# Patient Record
Sex: Female | Born: 1975 | Race: White | Hispanic: No | Smoking: Never smoker
Health system: Southern US, Community
[De-identification: ages and names within clinical notes are randomized; demographics above are authoritative.]

## PROBLEM LIST (undated history)

## (undated) DIAGNOSIS — I10 Essential (primary) hypertension: Secondary | ICD-10-CM

## (undated) DIAGNOSIS — D649 Anemia, unspecified: Secondary | ICD-10-CM

## (undated) DIAGNOSIS — G459 Transient cerebral ischemic attack, unspecified: Secondary | ICD-10-CM

## (undated) DIAGNOSIS — T8859XA Other complications of anesthesia, initial encounter: Secondary | ICD-10-CM

## (undated) DIAGNOSIS — R112 Nausea with vomiting, unspecified: Secondary | ICD-10-CM

## (undated) DIAGNOSIS — M2550 Pain in unspecified joint: Secondary | ICD-10-CM

## (undated) DIAGNOSIS — N3010 Interstitial cystitis (chronic) without hematuria: Secondary | ICD-10-CM

## (undated) DIAGNOSIS — R011 Cardiac murmur, unspecified: Secondary | ICD-10-CM

## (undated) DIAGNOSIS — N809 Endometriosis, unspecified: Secondary | ICD-10-CM

## (undated) DIAGNOSIS — Z8489 Family history of other specified conditions: Secondary | ICD-10-CM

## (undated) DIAGNOSIS — F1120 Opioid dependence, uncomplicated: Secondary | ICD-10-CM

## (undated) DIAGNOSIS — T4145XA Adverse effect of unspecified anesthetic, initial encounter: Secondary | ICD-10-CM

## (undated) DIAGNOSIS — Z8614 Personal history of Methicillin resistant Staphylococcus aureus infection: Secondary | ICD-10-CM

## (undated) DIAGNOSIS — E039 Hypothyroidism, unspecified: Secondary | ICD-10-CM

## (undated) DIAGNOSIS — Z9889 Other specified postprocedural states: Secondary | ICD-10-CM

## (undated) DIAGNOSIS — N200 Calculus of kidney: Secondary | ICD-10-CM

## (undated) DIAGNOSIS — M545 Low back pain: Secondary | ICD-10-CM

## (undated) HISTORY — PX: OTHER SURGICAL HISTORY: SHX169

## (undated) HISTORY — PX: DIAGNOSTIC LAPAROSCOPY: SUR761

## (undated) HISTORY — DX: Low back pain: M54.5

## (undated) HISTORY — PX: ABDOMINAL HYSTERECTOMY: SHX81

## (undated) HISTORY — PX: LITHOTRIPSY: SUR834

## (undated) HISTORY — DX: Opioid dependence, uncomplicated: F11.20

## (undated) HISTORY — PX: BREAST ENHANCEMENT SURGERY: SHX7

## (undated) HISTORY — DX: Pain in unspecified joint: M25.50

## (undated) HISTORY — PX: APPENDECTOMY: SHX54

---

## 2003-10-17 ENCOUNTER — Ambulatory Visit: Payer: Self-pay | Admitting: Pain Medicine

## 2003-11-22 ENCOUNTER — Other Ambulatory Visit: Payer: Self-pay

## 2003-11-27 ENCOUNTER — Ambulatory Visit: Payer: Self-pay | Admitting: Urology

## 2003-12-13 ENCOUNTER — Ambulatory Visit: Payer: Self-pay | Admitting: Pain Medicine

## 2003-12-27 ENCOUNTER — Ambulatory Visit: Payer: Self-pay | Admitting: Urology

## 2004-02-12 ENCOUNTER — Ambulatory Visit: Payer: Self-pay | Admitting: Pain Medicine

## 2004-04-15 ENCOUNTER — Ambulatory Visit: Payer: Self-pay | Admitting: Pain Medicine

## 2004-04-17 ENCOUNTER — Ambulatory Visit: Payer: Self-pay | Admitting: Pain Medicine

## 2004-05-15 ENCOUNTER — Ambulatory Visit: Payer: Self-pay | Admitting: Pain Medicine

## 2004-05-24 ENCOUNTER — Emergency Department: Payer: Self-pay | Admitting: Unknown Physician Specialty

## 2004-06-05 ENCOUNTER — Ambulatory Visit: Payer: Self-pay | Admitting: Pain Medicine

## 2004-07-15 ENCOUNTER — Ambulatory Visit: Payer: Self-pay | Admitting: Pain Medicine

## 2004-08-12 ENCOUNTER — Ambulatory Visit: Payer: Self-pay | Admitting: Pain Medicine

## 2004-08-26 ENCOUNTER — Ambulatory Visit: Payer: Self-pay | Admitting: Pain Medicine

## 2004-10-09 ENCOUNTER — Ambulatory Visit: Payer: Self-pay | Admitting: Pain Medicine

## 2004-10-17 ENCOUNTER — Ambulatory Visit: Payer: Self-pay | Admitting: Pain Medicine

## 2004-11-03 ENCOUNTER — Ambulatory Visit: Payer: Self-pay | Admitting: Pain Medicine

## 2004-12-04 ENCOUNTER — Ambulatory Visit: Payer: Self-pay | Admitting: Pain Medicine

## 2004-12-24 ENCOUNTER — Ambulatory Visit: Payer: Self-pay | Admitting: Pain Medicine

## 2005-01-05 DIAGNOSIS — G459 Transient cerebral ischemic attack, unspecified: Secondary | ICD-10-CM

## 2005-01-05 HISTORY — DX: Transient cerebral ischemic attack, unspecified: G45.9

## 2005-01-27 ENCOUNTER — Ambulatory Visit: Payer: Self-pay | Admitting: Pain Medicine

## 2005-02-23 ENCOUNTER — Ambulatory Visit: Payer: Self-pay | Admitting: Pain Medicine

## 2005-03-17 ENCOUNTER — Ambulatory Visit: Payer: Self-pay | Admitting: Pain Medicine

## 2005-04-15 ENCOUNTER — Ambulatory Visit: Payer: Self-pay | Admitting: Urology

## 2005-04-23 ENCOUNTER — Inpatient Hospital Stay: Payer: Self-pay | Admitting: Urology

## 2005-05-05 ENCOUNTER — Ambulatory Visit: Payer: Self-pay | Admitting: Pain Medicine

## 2005-06-02 ENCOUNTER — Ambulatory Visit: Payer: Self-pay | Admitting: Pain Medicine

## 2005-06-29 ENCOUNTER — Ambulatory Visit: Payer: Self-pay | Admitting: Pain Medicine

## 2005-07-15 ENCOUNTER — Ambulatory Visit: Payer: Self-pay | Admitting: Urology

## 2005-07-19 ENCOUNTER — Emergency Department: Payer: Self-pay | Admitting: Emergency Medicine

## 2005-07-21 ENCOUNTER — Ambulatory Visit: Payer: Self-pay | Admitting: Pain Medicine

## 2005-07-27 ENCOUNTER — Ambulatory Visit: Payer: Self-pay | Admitting: Pain Medicine

## 2005-12-06 ENCOUNTER — Inpatient Hospital Stay: Payer: Self-pay | Admitting: Vascular Surgery

## 2006-01-13 ENCOUNTER — Emergency Department: Payer: Self-pay | Admitting: Emergency Medicine

## 2006-04-07 ENCOUNTER — Ambulatory Visit: Payer: Self-pay | Admitting: Urology

## 2006-04-08 ENCOUNTER — Ambulatory Visit: Payer: Self-pay | Admitting: Urology

## 2006-08-23 ENCOUNTER — Ambulatory Visit: Payer: Self-pay | Admitting: Obstetrics & Gynecology

## 2006-08-24 ENCOUNTER — Ambulatory Visit: Payer: Self-pay | Admitting: Obstetrics & Gynecology

## 2006-11-05 ENCOUNTER — Ambulatory Visit: Payer: Self-pay | Admitting: Urology

## 2007-02-13 ENCOUNTER — Emergency Department: Payer: Self-pay | Admitting: Emergency Medicine

## 2007-08-17 ENCOUNTER — Ambulatory Visit: Payer: Self-pay | Admitting: Urology

## 2007-08-18 ENCOUNTER — Ambulatory Visit: Payer: Self-pay | Admitting: Urology

## 2007-08-25 ENCOUNTER — Ambulatory Visit: Payer: Self-pay | Admitting: Urology

## 2007-09-13 ENCOUNTER — Ambulatory Visit: Payer: Self-pay | Admitting: Urology

## 2007-09-22 ENCOUNTER — Ambulatory Visit: Payer: Self-pay | Admitting: Urology

## 2007-10-13 ENCOUNTER — Ambulatory Visit: Payer: Self-pay | Admitting: Urology

## 2007-11-23 ENCOUNTER — Ambulatory Visit: Payer: Self-pay | Admitting: Obstetrics & Gynecology

## 2007-11-29 ENCOUNTER — Ambulatory Visit: Payer: Self-pay | Admitting: Obstetrics & Gynecology

## 2008-08-24 ENCOUNTER — Ambulatory Visit: Payer: Self-pay | Admitting: Urology

## 2008-09-09 ENCOUNTER — Emergency Department: Payer: Self-pay | Admitting: Emergency Medicine

## 2008-09-24 ENCOUNTER — Ambulatory Visit: Payer: Self-pay | Admitting: Urology

## 2008-10-30 ENCOUNTER — Ambulatory Visit: Payer: Self-pay | Admitting: Urology

## 2009-02-14 ENCOUNTER — Ambulatory Visit: Payer: Self-pay | Admitting: Urology

## 2009-05-03 ENCOUNTER — Ambulatory Visit: Payer: Self-pay | Admitting: Urology

## 2009-05-10 ENCOUNTER — Ambulatory Visit: Payer: Self-pay | Admitting: Urology

## 2009-10-22 ENCOUNTER — Ambulatory Visit: Payer: Self-pay | Admitting: Urology

## 2010-01-05 DIAGNOSIS — Z8614 Personal history of Methicillin resistant Staphylococcus aureus infection: Secondary | ICD-10-CM

## 2010-01-05 HISTORY — DX: Personal history of Methicillin resistant Staphylococcus aureus infection: Z86.14

## 2010-05-14 ENCOUNTER — Emergency Department: Payer: Self-pay | Admitting: Emergency Medicine

## 2010-07-13 ENCOUNTER — Emergency Department: Payer: Self-pay | Admitting: Emergency Medicine

## 2010-10-24 ENCOUNTER — Other Ambulatory Visit: Payer: Self-pay | Admitting: Family Medicine

## 2010-10-27 ENCOUNTER — Ambulatory Visit: Payer: Self-pay | Admitting: Family Medicine

## 2011-02-09 ENCOUNTER — Emergency Department: Payer: Self-pay | Admitting: Emergency Medicine

## 2011-02-09 LAB — URINALYSIS, COMPLETE
Bacteria: NONE SEEN
Ketone: NEGATIVE
Ph: 7 (ref 4.5–8.0)
RBC,UR: 3 /HPF (ref 0–5)
Specific Gravity: 1.003 (ref 1.003–1.030)
Squamous Epithelial: 1
WBC UR: 2 /HPF (ref 0–5)

## 2011-02-09 LAB — PREGNANCY, URINE: Pregnancy Test, Urine: NEGATIVE m[IU]/mL

## 2011-02-09 LAB — CBC
HCT: 34.8 % — ABNORMAL LOW (ref 35.0–47.0)
HGB: 11.8 g/dL — ABNORMAL LOW (ref 12.0–16.0)
MCHC: 34 g/dL (ref 32.0–36.0)
Platelet: 230 10*3/uL (ref 150–440)
RBC: 4.25 10*6/uL (ref 3.80–5.20)

## 2011-02-09 LAB — BASIC METABOLIC PANEL
BUN: 4 mg/dL — ABNORMAL LOW (ref 7–18)
Creatinine: 0.69 mg/dL (ref 0.60–1.30)
EGFR (African American): >60
EGFR (Non-African Amer.): >60
Glucose: 100 mg/dL — ABNORMAL HIGH (ref 65–99)
Osmolality: 275 (ref 275–301)
Potassium: 3.1 mmol/L — ABNORMAL LOW (ref 3.5–5.1)
Sodium: 139 mmol/L (ref 136–145)

## 2011-03-31 DIAGNOSIS — F1120 Opioid dependence, uncomplicated: Secondary | ICD-10-CM | POA: Insufficient documentation

## 2011-03-31 DIAGNOSIS — N200 Calculus of kidney: Secondary | ICD-10-CM | POA: Insufficient documentation

## 2011-08-15 ENCOUNTER — Emergency Department: Payer: Self-pay | Admitting: Emergency Medicine

## 2011-08-15 LAB — CBC
HCT: 41.5 % (ref 35.0–47.0)
MCH: 29 pg (ref 26.0–34.0)
MCHC: 34.8 g/dL (ref 32.0–36.0)
MCV: 83 fL (ref 80–100)
Platelet: 277 10*3/uL (ref 150–440)
RDW: 14.8 % — ABNORMAL HIGH (ref 11.5–14.5)

## 2011-08-15 LAB — COMPREHENSIVE METABOLIC PANEL
Albumin: 4.4 g/dL (ref 3.4–5.0)
Alkaline Phosphatase: 132 U/L (ref 50–136)
BUN: 5 mg/dL — ABNORMAL LOW (ref 7–18)
Calcium, Total: 9.5 mg/dL (ref 8.5–10.1)
Chloride: 107 mmol/L (ref 98–107)
Co2: 26 mmol/L (ref 21–32)
EGFR (African American): >60
Glucose: 109 mg/dL — ABNORMAL HIGH (ref 65–99)
Potassium: 3.8 mmol/L (ref 3.5–5.1)
SGOT(AST): 27 U/L (ref 15–37)
SGPT (ALT): 31 U/L (ref 12–78)
Total Protein: 8.6 g/dL — ABNORMAL HIGH (ref 6.4–8.2)

## 2011-08-15 LAB — URINALYSIS, COMPLETE
Ketone: NEGATIVE
Nitrite: NEGATIVE
Protein: NEGATIVE
WBC UR: 4 /HPF (ref 0–5)

## 2011-10-20 ENCOUNTER — Ambulatory Visit: Payer: Self-pay | Admitting: Obstetrics & Gynecology

## 2011-10-21 ENCOUNTER — Ambulatory Visit: Payer: Self-pay | Admitting: Obstetrics & Gynecology

## 2011-11-09 ENCOUNTER — Emergency Department: Payer: Self-pay | Admitting: Emergency Medicine

## 2011-11-09 LAB — CBC
HCT: 39.5 % (ref 35.0–47.0)
HGB: 14.1 g/dL (ref 12.0–16.0)
MCH: 29.8 pg (ref 26.0–34.0)
MCHC: 35.8 g/dL (ref 32.0–36.0)
MCV: 83 fL (ref 80–100)
Platelet: 285 10*3/uL (ref 150–440)

## 2011-11-09 LAB — URINALYSIS, COMPLETE
Bilirubin,UR: NEGATIVE
Glucose,UR: NEGATIVE mg/dL (ref 0–75)
Leukocyte Esterase: NEGATIVE
Nitrite: NEGATIVE
Protein: NEGATIVE
RBC,UR: 4 /HPF (ref 0–5)
Specific Gravity: 1.004 (ref 1.003–1.030)
Squamous Epithelial: 5

## 2011-11-09 LAB — COMPREHENSIVE METABOLIC PANEL
Albumin: 4.2 g/dL (ref 3.4–5.0)
Anion Gap: 11 (ref 7–16)
BUN: 7 mg/dL (ref 7–18)
Calcium, Total: 9 mg/dL (ref 8.5–10.1)
EGFR (African American): >60
Glucose: 117 mg/dL — ABNORMAL HIGH (ref 65–99)
Osmolality: 280 (ref 275–301)
SGOT(AST): 27 U/L (ref 15–37)
SGPT (ALT): 47 U/L (ref 12–78)
Sodium: 141 mmol/L (ref 136–145)
Total Protein: 8.1 g/dL (ref 6.4–8.2)

## 2012-01-28 DIAGNOSIS — F411 Generalized anxiety disorder: Secondary | ICD-10-CM | POA: Insufficient documentation

## 2012-01-28 DIAGNOSIS — F1120 Opioid dependence, uncomplicated: Secondary | ICD-10-CM

## 2012-01-28 HISTORY — DX: Opioid dependence, uncomplicated: F11.20

## 2012-05-24 ENCOUNTER — Ambulatory Visit: Payer: Self-pay | Admitting: Family Medicine

## 2012-06-09 ENCOUNTER — Emergency Department: Payer: Self-pay | Admitting: Emergency Medicine

## 2012-06-09 LAB — URINALYSIS, COMPLETE
Bacteria: NONE SEEN
Bilirubin,UR: NEGATIVE
Glucose,UR: NEGATIVE mg/dL (ref 0–75)
Ketone: NEGATIVE
Leukocyte Esterase: NEGATIVE
Nitrite: NEGATIVE
Ph: 5 (ref 4.5–8.0)
RBC,UR: 3 /HPF (ref 0–5)
Squamous Epithelial: 1
WBC UR: 1 /HPF (ref 0–5)

## 2012-06-09 LAB — BASIC METABOLIC PANEL
BUN: 10 mg/dL (ref 7–18)
Calcium, Total: 9.6 mg/dL (ref 8.5–10.1)
Chloride: 105 mmol/L (ref 98–107)
Co2: 25 mmol/L (ref 21–32)
Glucose: 104 mg/dL — ABNORMAL HIGH (ref 65–99)
Osmolality: 277 (ref 275–301)
Potassium: 3.5 mmol/L (ref 3.5–5.1)
Sodium: 139 mmol/L (ref 136–145)

## 2012-06-09 LAB — CBC
MCH: 28 pg (ref 26.0–34.0)
Platelet: 338 10*3/uL (ref 150–440)
WBC: 11.3 10*3/uL — ABNORMAL HIGH (ref 3.6–11.0)

## 2012-06-12 ENCOUNTER — Emergency Department: Payer: Self-pay | Admitting: Emergency Medicine

## 2012-06-12 LAB — COMPREHENSIVE METABOLIC PANEL
Albumin: 3.9 g/dL (ref 3.4–5.0)
Alkaline Phosphatase: 158 U/L — ABNORMAL HIGH (ref 50–136)
Anion Gap: 6 — ABNORMAL LOW (ref 7–16)
Bilirubin,Total: 0.2 mg/dL (ref 0.2–1.0)
Calcium, Total: 9.4 mg/dL (ref 8.5–10.1)
Chloride: 109 mmol/L — ABNORMAL HIGH (ref 98–107)
Co2: 28 mmol/L (ref 21–32)
EGFR (Non-African Amer.): >60
Glucose: 97 mg/dL (ref 65–99)
Osmolality: 283 (ref 275–301)
SGPT (ALT): 34 U/L (ref 12–78)
Sodium: 143 mmol/L (ref 136–145)

## 2012-06-12 LAB — CBC
HCT: 39.1 % (ref 35.0–47.0)
HGB: 13.2 g/dL (ref 12.0–16.0)
MCH: 28.5 pg (ref 26.0–34.0)
MCV: 84 fL (ref 80–100)
Platelet: 267 10*3/uL (ref 150–440)
RBC: 4.63 10*6/uL (ref 3.80–5.20)
WBC: 7 10*3/uL (ref 3.6–11.0)

## 2012-06-12 LAB — URINALYSIS, COMPLETE
Bacteria: NONE SEEN
Bilirubin,UR: NEGATIVE
Glucose,UR: NEGATIVE mg/dL (ref 0–75)
Ketone: NEGATIVE
Nitrite: NEGATIVE
Ph: 6 (ref 4.5–8.0)
Protein: NEGATIVE

## 2012-06-16 DIAGNOSIS — F39 Unspecified mood [affective] disorder: Secondary | ICD-10-CM | POA: Insufficient documentation

## 2012-06-22 ENCOUNTER — Ambulatory Visit: Payer: Self-pay

## 2012-11-10 ENCOUNTER — Ambulatory Visit: Payer: Self-pay | Admitting: Specialist

## 2013-02-04 DIAGNOSIS — M2550 Pain in unspecified joint: Secondary | ICD-10-CM

## 2013-02-04 DIAGNOSIS — G4700 Insomnia, unspecified: Secondary | ICD-10-CM | POA: Insufficient documentation

## 2013-02-04 HISTORY — DX: Pain in unspecified joint: M25.50

## 2013-05-23 DIAGNOSIS — M545 Low back pain, unspecified: Secondary | ICD-10-CM

## 2013-05-23 HISTORY — DX: Low back pain, unspecified: M54.50

## 2013-08-17 DIAGNOSIS — R079 Chest pain, unspecified: Secondary | ICD-10-CM | POA: Insufficient documentation

## 2013-12-07 ENCOUNTER — Observation Stay: Payer: Self-pay | Admitting: Internal Medicine

## 2013-12-07 LAB — URINALYSIS, COMPLETE
BILIRUBIN, UR: NEGATIVE
Bacteria: NONE SEEN
Glucose,UR: NEGATIVE mg/dL (ref 0–75)
KETONE: NEGATIVE
LEUKOCYTE ESTERASE: NEGATIVE
Nitrite: NEGATIVE
PROTEIN: NEGATIVE
Ph: 7 (ref 4.5–8.0)
RBC, UR: NONE SEEN /HPF (ref 0–5)
Specific Gravity: 1.002 (ref 1.003–1.030)
Squamous Epithelial: 4
WBC UR: NONE SEEN /HPF (ref 0–5)

## 2013-12-07 LAB — CBC
HCT: 41.9 % (ref 35.0–47.0)
HGB: 13.7 g/dL (ref 12.0–16.0)
MCH: 26.4 pg (ref 26.0–34.0)
MCHC: 32.6 g/dL (ref 32.0–36.0)
MCV: 81 fL (ref 80–100)
PLATELETS: 339 10*3/uL (ref 150–440)
RBC: 5.17 10*6/uL (ref 3.80–5.20)
RDW: 16.2 % — ABNORMAL HIGH (ref 11.5–14.5)
WBC: 9.2 10*3/uL (ref 3.6–11.0)

## 2013-12-07 LAB — COMPREHENSIVE METABOLIC PANEL
ALT: 31 U/L
ANION GAP: 6 — AB (ref 7–16)
Albumin: 3.9 g/dL (ref 3.4–5.0)
Alkaline Phosphatase: 186 U/L — ABNORMAL HIGH
BUN: 7 mg/dL (ref 7–18)
Bilirubin,Total: 0.3 mg/dL (ref 0.2–1.0)
CALCIUM: 9.3 mg/dL (ref 8.5–10.1)
CHLORIDE: 101 mmol/L (ref 98–107)
Co2: 30 mmol/L (ref 21–32)
Creatinine: 0.97 mg/dL (ref 0.60–1.30)
EGFR (African American): >60
EGFR (Non-African Amer.): >60
Glucose: 120 mg/dL — ABNORMAL HIGH (ref 65–99)
Osmolality: 273 (ref 275–301)
Potassium: 3.6 mmol/L (ref 3.5–5.1)
SGOT(AST): 30 U/L (ref 15–37)
SODIUM: 137 mmol/L (ref 136–145)
Total Protein: 8.7 g/dL — ABNORMAL HIGH (ref 6.4–8.2)

## 2013-12-07 LAB — LIPASE, BLOOD: LIPASE: 59 U/L — AB (ref 73–393)

## 2013-12-08 LAB — CBC WITH DIFFERENTIAL/PLATELET
BASOS ABS: 0.1 10*3/uL (ref 0.0–0.1)
Basophil %: 0.7 %
EOS ABS: 0.2 10*3/uL (ref 0.0–0.7)
Eosinophil %: 1.4 %
HCT: 36.4 % (ref 35.0–47.0)
HGB: 11.6 g/dL — AB (ref 12.0–16.0)
LYMPHS ABS: 3.6 10*3/uL (ref 1.0–3.6)
Lymphocyte %: 33.7 %
MCH: 25.8 pg — ABNORMAL LOW (ref 26.0–34.0)
MCHC: 32 g/dL (ref 32.0–36.0)
MCV: 81 fL (ref 80–100)
MONO ABS: 0.8 x10 3/mm (ref 0.2–0.9)
Monocyte %: 7.5 %
NEUTROS ABS: 6.1 10*3/uL (ref 1.4–6.5)
NEUTROS PCT: 56.7 %
Platelet: 281 10*3/uL (ref 150–440)
RBC: 4.51 10*6/uL (ref 3.80–5.20)
RDW: 16 % — ABNORMAL HIGH (ref 11.5–14.5)
WBC: 10.8 10*3/uL (ref 3.6–11.0)

## 2013-12-08 LAB — BASIC METABOLIC PANEL
Anion Gap: 8 (ref 7–16)
BUN: 7 mg/dL (ref 7–18)
CALCIUM: 8.4 mg/dL — AB (ref 8.5–10.1)
CREATININE: 0.93 mg/dL (ref 0.60–1.30)
Chloride: 105 mmol/L (ref 98–107)
Co2: 28 mmol/L (ref 21–32)
EGFR (African American): >60
GLUCOSE: 103 mg/dL — AB (ref 65–99)
Osmolality: 279 (ref 275–301)
Potassium: 4 mmol/L (ref 3.5–5.1)
Sodium: 141 mmol/L (ref 136–145)

## 2013-12-27 ENCOUNTER — Ambulatory Visit: Payer: Self-pay | Admitting: Urology

## 2014-01-02 ENCOUNTER — Emergency Department: Payer: Self-pay | Admitting: Student

## 2014-01-02 LAB — URINALYSIS, COMPLETE
BILIRUBIN, UR: NEGATIVE
Bacteria: NONE SEEN
Glucose,UR: NEGATIVE mg/dL (ref 0–75)
Ketone: NEGATIVE
Nitrite: NEGATIVE
PROTEIN: NEGATIVE
Ph: 6 (ref 4.5–8.0)
RBC,UR: 29 /HPF (ref 0–5)
Specific Gravity: 1.012 (ref 1.003–1.030)
Squamous Epithelial: 13
WBC UR: 3 /HPF (ref 0–5)

## 2014-01-02 LAB — CBC WITH DIFFERENTIAL/PLATELET
Basophil #: 0.1 10*3/uL (ref 0.0–0.1)
Basophil %: 0.9 %
Eosinophil #: 0 10*3/uL (ref 0.0–0.7)
Eosinophil %: 0.3 %
HCT: 40.9 % (ref 35.0–47.0)
HGB: 13.4 g/dL (ref 12.0–16.0)
Lymphocyte #: 1.1 10*3/uL (ref 1.0–3.6)
Lymphocyte %: 14.6 %
MCH: 26 pg (ref 26.0–34.0)
MCHC: 32.8 g/dL (ref 32.0–36.0)
MCV: 80 fL (ref 80–100)
Monocyte #: 0.3 x10 3/mm (ref 0.2–0.9)
Monocyte %: 4.1 %
Neutrophil #: 5.9 10*3/uL (ref 1.4–6.5)
Neutrophil %: 80.1 %
PLATELETS: 323 10*3/uL (ref 150–440)
RBC: 5.15 10*6/uL (ref 3.80–5.20)
RDW: 16.1 % — ABNORMAL HIGH (ref 11.5–14.5)
WBC: 7.4 10*3/uL (ref 3.6–11.0)

## 2014-01-02 LAB — COMPREHENSIVE METABOLIC PANEL
ALK PHOS: 155 U/L — AB
AST: 28 U/L (ref 15–37)
Albumin: 3.8 g/dL (ref 3.4–5.0)
Anion Gap: 7 (ref 7–16)
BUN: 4 mg/dL — AB (ref 7–18)
Bilirubin,Total: 0.2 mg/dL (ref 0.2–1.0)
CHLORIDE: 103 mmol/L (ref 98–107)
CO2: 26 mmol/L (ref 21–32)
Calcium, Total: 9 mg/dL (ref 8.5–10.1)
Creatinine: 1.03 mg/dL (ref 0.60–1.30)
EGFR (Non-African Amer.): >60
GLUCOSE: 177 mg/dL — AB (ref 65–99)
Osmolality: 273 (ref 275–301)
POTASSIUM: 3.5 mmol/L (ref 3.5–5.1)
SGPT (ALT): 42 U/L
SODIUM: 136 mmol/L (ref 136–145)
Total Protein: 8.3 g/dL — ABNORMAL HIGH (ref 6.4–8.2)

## 2014-01-02 LAB — HCG, QUANTITATIVE, PREGNANCY: BETA HCG, QUANT.: 2 m[IU]/mL

## 2014-01-02 LAB — LIPASE, BLOOD: Lipase: 58 U/L — ABNORMAL LOW (ref 73–393)

## 2014-04-24 NOTE — Op Note (Signed)
PATIENT NAME:  Susan BarriosGRIGGS, Ardel F MR#:  [id number] DATE OF BIRTH:  04/23/1975  DATE OF PROCEDURE:  10/21/2011  ATTENDING PHYSICIAN: Dr. Ida Roguehristopher Kenzly Rogoff   ASSISTANT: Dr. Velora Mediateobert Harris.   PREOPERATIVE DIAGNOSES:  1. Chronic abdominal pain.  2. History of appendectomy.   POSTOPERATIVE DIAGNOSIS: Retained appendiceal stump.   PROCEDURE PERFORMED: Laparoscopic appendectomy.   ANESTHESIA: General.   ESTIMATED BLOOD LOSS: 10 mL.   SPECIMEN: Appendix.   INDICATION FOR SURGERY: Ms. [name] is a pleasant 39 year old female with history of chronic abdominal pain and history of appendectomy. She was brought to the Operating Room by Dr. Tiburcio PeaHarris for cystoscopy and a diagnostic laparoscopy with lysis of adhesions. I was consulted intraoperatively as he had noted that she appears to have a retained appendix as was previously seen on her CT scan that did not appear inflamed.  However, as hee did not find any other etiologies for her pain and this issue was likely to come in the future as she had previously had an appendectomy, we elected to perform an appendectomy to ensure that this was not the cause of her pain.   DESCRIPTION OF PROCEDURE: A 5 mm infraumbilical port and left lower quadrant port were already placed prior to my arrival. I upsized the umbilical port with a 12 mm port and put in a suprapubic 5 mm port. I then grasped the appendix, used the Harmonic scalpel to take the mesoappendix and placed an endoscopic GIA 45 load across the base of the appendix.  I then pulled the appendix out through the supraumbilical port. I then used electrocautery to ensure there was no bleeding from the stump. At the end of the procedure, everything was hemostatic and I turned over the remainder of the case to Dr. Tiburcio PeaHarris.   There were no immediate complications.   ____________________________ Si Raiderhristopher A. Brook Mall, MD cal:cms D: 10/21/2011 09:07:28 ET T: 10/21/2011 09:32:33  ET JOB#: [id number]  cc: Cristal Deerhristopher A. Kathlen Sakurai, MD, <Dictator> Jarvis NewcomerHRISTOPHER A Khalfani Weideman MD ELECTRONICALLY SIGNED 10/25/2011 9:47

## 2014-04-24 NOTE — Op Note (Signed)
PATIENT NAME:  [name], [name] MR#:  [id number] DATE OF BIRTH:  [birth year]  DATE OF PROCEDURE:  10/21/2011  PREOPERATIVE DIAGNOSIS:  Chronic pelvic pain.  POSTOPERATIVE DIAGNOSIS:  Chronic pelvic pain.   PROCEDURE: Operative laparoscopy with lysis of adhesions and cystoscopy.   SURGEON: Annamarie MajorPaul Harris, M.D.   ANESTHESIA: General.   ESTIMATED BLOOD LOSS: Minimal.   COMPLICATIONS: None.   FINDINGS: The patient had adhesions in the pelvis. The patient no longer had uterus, tubes or ovaries, and there was no sign of endometriosis, pelvic congestion syndrome, or other mass. Appendiceal stump was observed, and was removed by general surgery.   DISPOSITION: To the recovery room in stable condition.   TECHNIQUE: The patient is prepped and draped in the usual sterile fashion after adequate anesthesia is obtained in the dorsal lithotomy position. Bladder is drained with a Robinson catheter. Attention is then turned to the abdomen where a Veress needle is inserted through a 5-mm infraumbilical incision after Marcaine is used to anesthetize the skin. Veress needle placement is confirmed using the hanging drop technique and the abdomen is then insufflated with CO2 gas. A 5-mm trocar is then inserted under direct visualization with the laparoscope with no injuries or bleeding noted. There were no adhesions on the anterior abdominal wall where trocars are placed. Additional trocars are placed in the suprapubic and left lower quadrant that are 5 mm in size.   The above-mentioned findings are noted after the patient is placed in Trendelenburg positioning. Adhesions of the colon to pelvic sidewall and abdominal sidewall are carefully dissected as these are filmy adhesions with no significant thickening or bowel involvement. There is no ureter or other involvement as well. The appendiceal stump is observed after careful dissection in this area as well. General surgery is called in. Please see their dictation as  they removed the appendiceal stump.   Excellent hemostasis is noted throughout the site where lysis of adhesions was performed. Gas is expelled and trocars are removed. The umbilical incision site had been expanded to fit a 12-mm trocar, and so the fascia at the site is closed with 2-0 Vicryl suture at this time. Skin is closed with Dermabond.   Cystoscopy is then performed with saline distention of the bladder using a 30-degree cystoscope. No signs of bladder lesions, injuries, interstitial cystitis, ulcerations, glomerulations, or other findings are observed. Normal bladder is observed. The bladder is drained and the cystoscope is removed. The patient goes to the recovery room in stable condition. All sponge, instrument, and needle counts are correct.   ____________________________ R. Annamarie MajorPaul Harris, MD rph:bjt D: 10/21/2011 09:17:58 ET T: 10/21/2011 09:44:25 ET JOB#: [id number]  cc: Dierdre Searles. Paul Harris, MD, <Dictator> Nadara MustardOBERT P HARRIS MD ELECTRONICALLY SIGNED 10/21/2011 17:25

## 2014-04-27 NOTE — Op Note (Signed)
PATIENT NAME:  Susan BarriosGRIGGS, Tameshia F MR#:  [id number] DATE OF BIRTH:  [birth year]  DATE OF PROCEDURE:  11/10/2012  PREOPERATIVE DIAGNOSIS: Right carpal tunnel syndrome.   POSTOPERATIVE DIAGNOSIS: Right carpal tunnel syndrome.   OPERATION: Right carpal tunnel release.   SURGEON: Valinda HoarHoward E. Korrey Schleicher, M.D.   ANESTHESIA: General LMA.   COMPLICATIONS: None.   DRAINS: None.   DESCRIPTION OF PROCEDURE: The patient was brought to the operating room where she underwent satisfactory general LMA anesthesia in the supine position. The right arm was prepped and draped in sterile fashion. An Esmarch was applied. The tourniquet was inflated to 250 mmHg. Tourniquet time was 16 minutes. A longitudinal incision was made in the palm just to the ulnar side of midline using the patient's long palmar crease. The dissection was carried out bluntly through subcutaneous tissue using loop magnification. The soft tissues were debrided off the volar carpal ligament distal to proximal. A Kelly clamp was passed beneath the edge of the volar carpal ligament to protect the nerve and a mini blade and knife was used to release the distal two thirds of the volar carpal ligament. Carpal tunnel scissors were then used to complete the release of the ligament under direct vision. The nerve had minimal impression on it. The ligament was thickened. The nerve was freed up from adhesions with a mosquito clamp. The motor branch was visualized and was intact. The wound was then irrigated and closed with running 4-0 nylon suture. Marcaine 0.5% was placed in the wound and a dry, sterile compression hand dressing with volar splint was applied. The tourniquet was deflated with good return of blood flow to the hand. The patient was awakened and taken to recovery in good condition.   ____________________________ Valinda HoarHoward E. Lawerance Matsuo, MD hem:aw D: 11/10/2012 11:26:43 ET T: 11/10/2012 11:29:55 ET JOB#: [id number]  cc: Valinda HoarHoward E. Deaire Mcwhirter, MD, <Dictator> Valinda HoarHOWARD  E Johnmatthew Solorio MD ELECTRONICALLY SIGNED 11/11/2012 14:10

## 2014-04-28 NOTE — Consult Note (Signed)
Brief Consult Note: Diagnosis: RLQ abdominal pain.   Patient was seen by consultant.   Discussed with Attending MD.   Comments: non-surgical etiology of pain, history of chronic pelvic pain,.  Electronic Signatures: Natale LayBird, Ishanvi Mcquitty (MD)  (Signed 03-Dec-15 17:06)  Authored: Brief Consult Note   Last Updated: 03-Dec-15 17:06 by Natale LayBird, Timika Muench (MD)

## 2014-04-28 NOTE — Consult Note (Signed)
PATIENT NAME:  [name], [name] MR#:  [id number] DATE OF BIRTH:  1975/07/28  DATE OF CONSULTATION:  12/07/2013  REFERRING PHYSICIAN:   CONSULTING PHYSICIAN:  Melquan Ernsberger A. Egbert GaribaldiBird, MD  REASON FOR CONSULTATION: Abdominal pain.   HISTORY OF PRESENT ILLNESS: A 39 year old white female with a complex past surgical history notable for a history of appendicitis in the year 2007, for which she when underwent an appendectomy. Of note, the patient then had a diagnostic laparoscopy for chronic ill-defined pelvic pain back in 2013, at which point residual appendix was removed. She had lysis of adhesions, as well as cystoscopy at that time. She has a history also of endometriosis. She has had a previous total abdominal salpingo-oophorectomy as well. The patient states that she has had a history of kidney stones as well; she has had multiple interventions for this in the past. Of note, the patient developed abdominal pain 4-5 days ago in the right lower quadrant, which was crampy in nature. She has had no nausea and no vomiting. She has had no fever. No sick contacts. She has had pain similar to this, but not as intense as per her report. Workup in the Emergency Room was unrevealing. As such, surgical services were asked to consult.   ALLERGIES: IMITREX, KETOROLAC, PHENERGAN, PREDNISONE, STADOL AND SULFA.   MEDICATIONS INCLUDE: Albuterol, clonidine, Synthroid, Roxicodone 10 mg once a day as needed, Savella, and Zofran.   PAST MEDICAL HISTORY: Significant for appendicitis, endometriosis chronic pelvic pain, hypertension, carpal tunnel syndrome, chronic back pain, history of nephrolithiasis.    SOCIAL HISTORY: She is married. Does not drink. Does not smoke.   PHYSICAL EXAMINATION:  VITAL SIGNS: Temperature is 98.2, pulse 70, respiratory rate 17, blood pressure is 156/96.  LUNGS: Clear.  HEART: Regular and rhythm.  ABDOMEN: Soft. There are multiple scars. There is no obvious hernia. There is significant tenderness to  palpation in the right lower quadrant. There is a positive psoas sign, negative Rovsing sign.   Review of CT scan is unrevealing. There is a 2 mm distal ureteral calculus in the left ureter. No inflammatory changes were seen in the right lower quadrant. No bowel obstruction. No hernias. No femoral hernia. No inguinal hernia.   Laboratory values are also likewise unrevealing. Urinalysis being negative. White count being 9.2, with normal electrolytes.   IMPRESSION: Abdominal pain, right lower quadrant, musculoskeletal in nature more than likely. There is an overlying component of a history of chronic pelvic pain, in addition. I do not think this represents a surgical etiology. At this point we will sign off. Further plans as per the medical service.    ____________________________ Redge GainerMark A. Egbert GaribaldiBird, MD mab:MT D: 12/07/2013 17:11:01 ET T: 12/07/2013 17:24:14 ET JOB#: [id number]  cc: Loraine LericheMark A. Egbert GaribaldiBird, MD, <Dictator> Raynald KempMARK A Cristen Murcia MD ELECTRONICALLY SIGNED 12/10/2013 11:04

## 2014-04-28 NOTE — Discharge Summary (Signed)
PATIENT NAME:  [name], [name] MR#:  [id number] DATE OF BIRTH:  1975-12-25  DATE OF ADMISSION:  12/07/2013 DATE OF DISCHARGE:  12/08/2013  ADMISSION DIAGNOSIS:  1.  Abdominal pain.  2.  Left-sided nephrolithiasis with a history of nephrolithiasis.   DISCHARGE DIAGNOSES:  1.  Right lower quadrant abdominal pain without evidence of appendicitis; this resolved.  2.  Left lower flank pain secondary to nephrolithiasis.  3.  History of hypothyroidism.  4.  History of interstitial cystitis.   CONSULTATIONS: Dr. Egbert GaribaldiBird.   DISCHARGE PHYSICAL EXAMINATION: VITAL SIGNS: Temperature is 98.6, pulse 80, respirations 20, blood pressure 135/89 and 100% on room air.  GENERAL: The patient is alert and oriented, not in acute distress.  CARDIOVASCULAR: Regular rate and rhythm. No murmurs, gallops, or rubs. PMI is not displaced.  LUNGS: Clear to auscultation without crackles, rales, rhonchi or wheezing. Normal to percussion.  BACK: No CVA tenderness. She has very mild flank pain on the left side.  ABDOMEN: Bowel sounds are positive. Nontender, nondistended. No hepatosplenomegaly. No rebound or guarding.  EXTREMITIES: No clubbing, cyanosis or edema.   LABORATORIES AT DISCHARGE: White blood cells 10, hemoglobin 11.6, hematocrit 37, platelets 281,000. Sodium 141, potassium 4, chloride 105, bicarb 28, BUN 7, creatinine 0.93, glucose 103.   HOSPITAL COURSE: This is a 39 year old female who presented with abdominal pain, right and left abdominal pain. Was found to have nephrolithiasis on the left side. For further details, please refer to the H and P.  1.  Right lower quadrant abdominal pain. The patient had a CT scan without evidence of appendicitis; however, she was admitted for her abdominal pain. Dr. Egbert GaribaldiBird from surgery was consulted. He did not feel that there is a pathological issue for abdominal pain. It seems like her abdominal pain has resolved.  2.  Left flank pain with mild hydronephrosis on CT scan and a  2 mm left kidney stone. Admitting physician did [facility]. The patient is well-known to this urological service as she has had history of kidney stones since age 89. He stated that there is 100% passage of 2 mm stone with hydration. She was placed on IV hydration. She will continue hydration at home.  3.  Hypothyroidism. The patient was continued on Synthroid.   DISCHARGE MEDICATIONS: 1.  Albuterol 2 puffs 4 times a day p.r.n.  2.  Zofran 4 mg every 8 hours p.r.n.  3.  Clonidine 0.1 mg weekly on Thursdays.  4.  Savella 50 mg daily for depression.  5.  Synthroid 50 mcg daily.  6.  Roxicodone 10 mg daily as needed, #20.   DISCHARGE DIET: Regular diet.   DISCHARGE INSTRUCTIONS: Continue aggressive hydration for kidney stone.  DISCHARGE ACTIVITY: As tolerated.   DISCHARGE FOLLOWUP: The [facility].  The patient is stable for discharge.   TIME SPENT: Approximately 45 minutes.   ____________________________ Janyth ContesSital P. Juliene PinaMody, MD spm:sb D: 12/08/2013 13:01:45 ET T: 12/08/2013 13:37:27 ET JOB#: [id number]  cc: Brynlea Spindler P. Juliene PinaMody, MD, <Dictator> [facility] Zavon Hyson P Madline Oesterling MD ELECTRONICALLY SIGNED 12/08/2013 14:04

## 2014-04-28 NOTE — H&P (Signed)
PATIENT NAME:  [name], [name] DATE OF BIRTH:  March 04, 1975  DATE OF ADMISSION:  12/07/2013  PRIMARY CARE PHYSICIAN:  Elizabeth Sauereanna Jones, MD.    UROLOGISMichiel Cowboy: Shannon McGowan, PA.    CHIEF COMPLAINT:  Left abdominal pain.   HISTORY OF PRESENT ILLNESS: This is a 39 year old female who presents to the ER after having a week of stone-related pains in her left side stabbing in her left kidney and flank, but last night it was 7 out of 10 in intensity, worse after urination, occasional blood in the urine. She has been having nausea and not eating very well. She has been drinking a lot of tea which is caffeinated. In the ER also then started having some right lower quadrant pain and pain radiating back into the right kidney. A CT scan was done by the ER physician that showed mild hydronephrosis on the left with a 2 mm stone, mild hydroureter, right side negative for stone. The patient is status post appendectomy and complete hysterectomy. Hospitalist services were contacted for further evaluation after the patient received 3 doses of IV Dilaudid.    PAST MEDICAL HISTORY: Hypothyroidism, numerous kidney stones, interstitial cystitis, excessive sweating.   PAST SURGICAL HISTORY: Multiple laparoscopic procedures for endometriosis, complete hysterectomy including ovaries, numerous lithotripsies, appendectomy x 2, breast implants, kidney stone removal via laparoscopic.   ALLERGIES: IMITREX, KETOROLAC, PHENERGAN, PREDNISONE, STADOL, AND SULFA.   SOCIAL HISTORY: No smoking. No alcohol. No drug use. Does child care in the house.   FAMILY HISTORY: Father has kidney stones, hypertension. Mother kidney stones, thyroid issues.   MEDICATIONS THAT THE PATIENT TAKES ON A DAILY BASIS: Include albuterol CFC 2 puffs 4 times a day as needed, clonidine patch 0.1 mg per 24 hour transdermal film, change weekly, levothyroxine 50 mcg daily, Roxicodone 10 mg once a day as needed for pain, Savella 50 mg daily, Zofran 4  mg every 8 hours as needed.   REVIEW OF SYSTEMS:    CONSTITUTIONAL: Positive for weakness. No fever, chills, or sweats. No weight loss. No weight gain.  EYES: No blurry vision.  EARS, NOSE, MOUTH, AND THROAT: No hearing loss. No sore throat. No difficulty swallowing.  CARDIOVASCULAR: No chest pain. No palpitations.  RESPIRATORY: No shortness of breath. No cough. No sputum. No hemoptysis.  GASTROINTESTINAL: Positive for nausea. Positive for abdominal pain. Positive for constipation. No bright red blood per rectum. No melena.  GENITOURINARY: Occasional burning on urination, pain in the abdomen worse after urination, a sticking-type feeling with urination.  MUSCULOSKELETAL: No joint pain or muscle pain.  INTEGUMENT: No rashes or eruptions.  NEUROLOGIC: No fainting or blackouts.  PSYCHIATRIC: Positive for anxiety.  ENDOCRINE: Positive for hypothyroidism.  HEMATOLOGIC AND LYMPHATIC: No anemia. No easy bruising or bleeding.   PHYSICAL EXAMINATION:  VITAL SIGNS: Temperature 98.2, pulse 98, respirations 18, blood pressure 107/75, pulse oximetry 100% on room air.  GENERAL: No respiratory distress.  EYES: Conjunctivae and lids normal. Pupils equal, round, and reactive to light. Extraocular muscles intact. No nystagmus.  EARS, NOSE, MOUTH, AND THROAT: Tympanic membranes, no erythema. Nasal mucosa, no erythema. Throat, no erythema. No exudate seen. Lips and gums, no lesions.  NECK: No JVD. No bruits. No lymphadenopathy. No thyromegaly. No thyroid nodules palpated.  RESPIRATORY:  Lungs clear to auscultation. No use of accessory muscles to breathe. No rhonchi, rales, or wheeze heard.  CARDIOVASCULAR: S1, S2 normal. No gallops, rubs, or murmurs heard. Carotid upstroke 2 + bilaterally. No bruits. Dorsalis pedis pulses 2 +  bilaterally. No edema of the lower extremity.  ABDOMEN: Soft. No organomegaly/splenomegaly. Normoactive bowel sounds. No masses felt. The patient does have some tenderness in the right  lower quadrant over the bladder and left lower quadrant. Positive CVA tenderness bilaterally.  LYMPHATIC: No lymph nodes in the neck.  MUSCULOSKELETAL: No clubbing, edema, or cyanosis.  SKIN: No rashes or ulcers seen.  NEUROLOGIC: Cranial nerves II through XII grossly intact. Deep tendon reflexes 2 + bilateral lower extremities.  PSYCHIATRIC: The patient is oriented to person, place, and time.   LABORATORY AND RADIOLOGICAL DATA: CT scan of the abdomen and pelvis shows mild hepatic fatty infiltration, very mild left hydronephrosis, mild distention of the distal left ureter without frank hydroureter, 2 mm nonobstructive calcified stone in the distal left ureter 1.5 cm from the left UVJ, the patient is status post appendectomy, limited assessment of the urinary bladder which is empty, otherwise negative CAT scan. Urinalysis 2 + blood, otherwise negative. Lipase 59. Glucose 120, BUN 7, creatinine 0.97, sodium 137, potassium 3.6, chloride 101, CO2 of 30, calcium 9.3, alkaline phosphatase 186. ALT 31, AST 30. White blood count 9.2, H and H  13.7 and 41.9, platelet count of 339,000.   ASSESSMENT AND PLAN:  1.  Right lower quadrant abdominal pain, left flank pain in the abdomen, found to have slight left hydronephrosis with a 2 mm left kidney stone, with history of interstitial cystitis. I will give IV fluid bolus x 2 wide open, no IV fluids given in the Emergency Room, I will give 150 mL per hour. I did speak with Dr. Edwyna Shell, the covering urologist, who recommended pain control and sending the patient home with followup with [facility], stating that there is 100% passage of 2 mm stones. I advised the patient to stay hydrated and discontinue caffeine.  2.  Hypothyroidism. Continue levothyroxine.  3.  Excessive sweating. On clonidine patch as per her gynecologist.  4.  With the right lower quadrant abdominal pain the ER physician Dr. Carollee Massed spoke with Dr. Egbert Garibaldi to evaluate since the patient had had 2  operations to remove her appendix. Appendix not visualized here on this CAT scan.    TIME SPENT ON ADMISSION: 50 minutes.    The patient will be admitted as an observation.    ____________________________ Herschell Dimes. Renae Gloss, MD rjw:bu D: 12/07/2013 17:01:45 ET T: 12/07/2013 17:16:16 ET JOB#: [id number]  cc: Herschell Dimes. Renae Gloss, MD, <Dictator> Duanne Limerick, MD Harle Battiest, PA  Salley Scarlet MD ELECTRONICALLY SIGNED 12/08/2013 12:17

## 2014-06-24 ENCOUNTER — Other Ambulatory Visit: Payer: Self-pay | Admitting: Family Medicine

## 2014-06-24 DIAGNOSIS — E039 Hypothyroidism, unspecified: Secondary | ICD-10-CM

## 2014-07-30 ENCOUNTER — Other Ambulatory Visit: Payer: Self-pay | Admitting: Family Medicine

## 2014-07-30 DIAGNOSIS — E039 Hypothyroidism, unspecified: Secondary | ICD-10-CM

## 2014-08-01 ENCOUNTER — Emergency Department
Admission: EM | Admit: 2014-08-01 | Discharge: 2014-08-01 | Disposition: A | Discharge status: Other Acute Inpatient Hospital | Payer: BLUE CROSS/BLUE SHIELD | Attending: Emergency Medicine | Admitting: Emergency Medicine

## 2014-08-01 ENCOUNTER — Inpatient Hospital Stay
Admission: EM | Admit: 2014-08-01 | Discharge: 2014-08-02 | DRG: 885 | Disposition: A | Discharge status: Home / Self Care | Payer: BLUE CROSS/BLUE SHIELD | Source: Intra-hospital | Attending: Psychiatry | Admitting: Psychiatry

## 2014-08-01 ENCOUNTER — Encounter: Payer: Self-pay | Admitting: *Deleted

## 2014-08-01 ENCOUNTER — Encounter: Payer: Self-pay | Admitting: Emergency Medicine

## 2014-08-01 DIAGNOSIS — F515 Nightmare disorder: Secondary | ICD-10-CM | POA: Diagnosis present

## 2014-08-01 DIAGNOSIS — G4700 Insomnia, unspecified: Secondary | ICD-10-CM | POA: Diagnosis present

## 2014-08-01 DIAGNOSIS — F329 Major depressive disorder, single episode, unspecified: Secondary | ICD-10-CM | POA: Diagnosis not present

## 2014-08-01 DIAGNOSIS — Z9049 Acquired absence of other specified parts of digestive tract: Secondary | ICD-10-CM | POA: Diagnosis present

## 2014-08-01 DIAGNOSIS — F332 Major depressive disorder, recurrent severe without psychotic features: Secondary | ICD-10-CM | POA: Diagnosis present

## 2014-08-01 DIAGNOSIS — R45851 Suicidal ideations: Secondary | ICD-10-CM | POA: Diagnosis present

## 2014-08-01 DIAGNOSIS — F32A Depression, unspecified: Secondary | ICD-10-CM

## 2014-08-01 DIAGNOSIS — G8929 Other chronic pain: Secondary | ICD-10-CM | POA: Diagnosis present

## 2014-08-01 DIAGNOSIS — Z62810 Personal history of physical and sexual abuse in childhood: Secondary | ICD-10-CM | POA: Diagnosis present

## 2014-08-01 DIAGNOSIS — Z818 Family history of other mental and behavioral disorders: Secondary | ICD-10-CM | POA: Diagnosis not present

## 2014-08-01 DIAGNOSIS — Z79899 Other long term (current) drug therapy: Secondary | ICD-10-CM | POA: Diagnosis not present

## 2014-08-01 DIAGNOSIS — F411 Generalized anxiety disorder: Secondary | ICD-10-CM | POA: Diagnosis present

## 2014-08-01 DIAGNOSIS — Z90710 Acquired absence of both cervix and uterus: Secondary | ICD-10-CM

## 2014-08-01 DIAGNOSIS — E039 Hypothyroidism, unspecified: Secondary | ICD-10-CM | POA: Diagnosis present

## 2014-08-01 DIAGNOSIS — Z79891 Long term (current) use of opiate analgesic: Secondary | ICD-10-CM | POA: Diagnosis not present

## 2014-08-01 DIAGNOSIS — Z046 Encounter for general psychiatric examination, requested by authority: Secondary | ICD-10-CM | POA: Diagnosis present

## 2014-08-01 HISTORY — DX: Interstitial cystitis (chronic) without hematuria: N30.10

## 2014-08-01 HISTORY — DX: Endometriosis, unspecified: N80.9

## 2014-08-01 LAB — CBC
HEMATOCRIT: 41.1 % (ref 35.0–47.0)
Hemoglobin: 13.7 g/dL (ref 12.0–16.0)
MCH: 26.8 pg (ref 26.0–34.0)
MCHC: 33.4 g/dL (ref 32.0–36.0)
MCV: 80.2 fL (ref 80.0–100.0)
PLATELETS: 257 10*3/uL (ref 150–440)
RBC: 5.12 MIL/uL (ref 3.80–5.20)
RDW: 16.5 % — ABNORMAL HIGH (ref 11.5–14.5)
WBC: 10.1 10*3/uL (ref 3.6–11.0)

## 2014-08-01 LAB — URINE DRUG SCREEN, QUALITATIVE (ARMC ONLY)
Amphetamines, Ur Screen: NOT DETECTED
Barbiturates, Ur Screen: NOT DETECTED
Benzodiazepine, Ur Scrn: POSITIVE — AB
COCAINE METABOLITE, UR ~~LOC~~: NOT DETECTED
Cannabinoid 50 Ng, Ur ~~LOC~~: NOT DETECTED
MDMA (Ecstasy)Ur Screen: NOT DETECTED
Methadone Scn, Ur: POSITIVE — AB
Opiate, Ur Screen: POSITIVE — AB
Phencyclidine (PCP) Ur S: NOT DETECTED
Tricyclic, Ur Screen: NOT DETECTED

## 2014-08-01 LAB — ACETAMINOPHEN LEVEL: Acetaminophen (Tylenol), Serum: <10 ug/mL — ABNORMAL LOW (ref 10–30)

## 2014-08-01 LAB — COMPREHENSIVE METABOLIC PANEL
ALBUMIN: 4.5 g/dL (ref 3.5–5.0)
ALT: 28 U/L (ref 14–54)
ANION GAP: 9 (ref 5–15)
Alkaline Phosphatase: 96 U/L (ref 38–126)
BILIRUBIN TOTAL: 1.1 mg/dL (ref 0.3–1.2)
BUN: 14 mg/dL (ref 6–20)
CO2: 24 mmol/L (ref 22–32)
CREATININE: 1 mg/dL (ref 0.44–1.00)
Calcium: 9.7 mg/dL (ref 8.9–10.3)
Chloride: 105 mmol/L (ref 101–111)
GFR calc Af Amer: >60 mL/min (ref >60)
GFR calc non Af Amer: >60 mL/min (ref >60)
Glucose, Bld: 139 mg/dL — ABNORMAL HIGH (ref 65–99)
SODIUM: 138 mmol/L (ref 135–145)
Total Protein: 7.9 g/dL (ref 6.5–8.1)

## 2014-08-01 LAB — ETHANOL: Alcohol, Ethyl (B): <5 mg/dL (ref <5)

## 2014-08-01 LAB — SALICYLATE LEVEL: Salicylate Lvl: <4 mg/dL (ref 2.8–30.0)

## 2014-08-01 MED ORDER — OXYCODONE HCL 10 MG PO TABS: 10.0000 mg | ORAL_TABLET | Freq: Four times a day (QID) | ORAL | Status: DC | PRN

## 2014-08-01 MED ORDER — HYDROXYZINE HCL 25 MG PO TABS: 25.0000 mg | ORAL_TABLET | Freq: Four times a day (QID) | ORAL | Status: DC | PRN

## 2014-08-01 MED ORDER — CLONAZEPAM 0.5 MG PO TABS
0.5000 mg | ORAL_TABLET | Freq: Three times a day (TID) | ORAL | Status: DC | PRN
  Filled 2014-08-01 (×2): qty 1

## 2014-08-01 MED ORDER — BUPROPION HCL ER (SR) 150 MG PO TB12
150.0000 mg | ORAL_TABLET | Freq: Two times a day (BID) | ORAL | Status: DC
  Administered 2014-08-01: 150 mg via ORAL
  Filled 2014-08-01 (×2): qty 1

## 2014-08-01 MED ORDER — OXYCODONE HCL 5 MG PO TABS
10.0000 mg | ORAL_TABLET | Freq: Four times a day (QID) | ORAL | Status: DC | PRN
  Administered 2014-08-02 (×2): 10 mg via ORAL
  Filled 2014-08-01 (×2): qty 2

## 2014-08-01 MED ORDER — LEVOTHYROXINE SODIUM 50 MCG PO TABS
50.0000 ug | ORAL_TABLET | Freq: Every day | ORAL | Status: DC
  Administered 2014-08-02: 50 ug via ORAL
  Filled 2014-08-01: qty 1

## 2014-08-01 MED ORDER — ACETAMINOPHEN 325 MG PO TABS: 650.0000 mg | ORAL_TABLET | Freq: Four times a day (QID) | ORAL | Status: DC | PRN

## 2014-08-01 MED ORDER — MAGNESIUM HYDROXIDE 400 MG/5ML PO SUSP: 30.0000 mL | Freq: Every day | ORAL | Status: DC | PRN

## 2014-08-01 MED ORDER — ALUM & MAG HYDROXIDE-SIMETH 200-200-20 MG/5ML PO SUSP: 30.0000 mL | ORAL | Status: DC | PRN

## 2014-08-01 MED ORDER — METHADONE HCL 10 MG PO TABS
10.0000 mg | ORAL_TABLET | ORAL | Status: DC
  Administered 2014-08-01 (×3): 10 mg via ORAL
  Filled 2014-08-01 (×3): qty 1

## 2014-08-01 MED ORDER — METHADONE HCL 10 MG PO TABS
10.0000 mg | ORAL_TABLET | Freq: Four times a day (QID) | ORAL | Status: DC
  Administered 2014-08-01 – 2014-08-02 (×4): 10 mg via ORAL
  Filled 2014-08-01 (×4): qty 1

## 2014-08-01 MED ORDER — CLONAZEPAM 1 MG PO TABS
1.0000 mg | ORAL_TABLET | Freq: Two times a day (BID) | ORAL | Status: DC
  Administered 2014-08-01: 1 mg via ORAL
  Filled 2014-08-01: qty 1

## 2014-08-01 MED ORDER — LEVOTHYROXINE SODIUM 50 MCG PO TABS
50.0000 ug | ORAL_TABLET | ORAL | Status: DC
  Administered 2014-08-01: 50 ug via ORAL
  Filled 2014-08-01 (×2): qty 1

## 2014-08-01 MED ORDER — MIRTAZAPINE 15 MG PO TABS
15.0000 mg | ORAL_TABLET | Freq: Every day | ORAL | Status: DC
  Administered 2014-08-01: 15 mg via ORAL
  Filled 2014-08-01: qty 1

## 2014-08-01 MED ORDER — QUETIAPINE FUMARATE 25 MG PO TABS
25.0000 mg | ORAL_TABLET | Freq: Every day | ORAL | Status: DC
  Administered 2014-08-01: 25 mg via ORAL
  Filled 2014-08-01: qty 1

## 2014-08-01 NOTE — ED Notes (Signed)
BEHAVIORAL HEALTH ROUNDING  Patient sleeping: Yes.  Patient alert and oriented: Pt is sleeping  Behavior appropriate: Yes. ; If no, describe: Pt is sleeping  Nutrition and fluids offered: Pt is sleeping  Toileting and hygiene offered: Pt is sleeping  Sitter present: yes  Law enforcement present: Yes   

## 2014-08-01 NOTE — ED Notes (Signed)
BEHAVIORAL HEALTH ROUNDING Patient sleeping: No. Patient alert and oriented: yes Behavior appropriate: Yes.  ; If no, describe:  Nutrition and fluids offered: Yes  Toileting and hygiene offered: Yes  Sitter present: yes Law enforcement present: Yes  

## 2014-08-01 NOTE — ED Notes (Signed)
BEHAVIORAL HEALTH ROUNDING Patient sleeping: No. Patient alert and oriented: yes Behavior appropriate: Yes.  ; If no, describe:  Nutrition and fluids offered: Yes  Toileting and hygiene offered: Yes  Sitter present: no Law enforcement present: Yes  
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
## 2014-08-01 NOTE — ED Notes (Signed)

## 2014-08-01 NOTE — BHH Counselor (Addendum)
Pt. is to be admitted to [facility] by Dr. Toni Amend. Attending Physician will be Dr. Jennet Maduro.  Pt. has been assigned to room 319, by [facility] Charge Nurse Edwena Bunde.  Intake Paper Work has been signed and placed on pt. chart. ER staff Valeda Malm, ER Sect.; Dr. Lenard Lance, ER MD & Edwena Felty. Patient's Nurse, Bradly Chris, Patient's Access) have been made aware of the admission.
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
## 2014-08-01 NOTE — ED Provider Notes (Signed)
-----------------------------------------   3:18 PM on 08/01/2014 -----------------------------------------  The patient has been seen and evaluated by psychiatry and they believe that the patient requires further inpatient treatment and will be admitted to [facility] for further care.  Minna Antis, MD 08/01/14 [phone]
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
## 2014-08-01 NOTE — ED Notes (Signed)
ED BHU PLACEMENT JUSTIFICATION Is the patient under IVC or is there intent for IVC: Yes.   Is the patient medically cleared: Yes.   Is there vacancy in the ED BHU: No. Is the population mix appropriate for patient: No. Is the patient awaiting placement in inpatient or outpatient setting: Yes.   Has the patient had a psychiatric consult: No. Survey of unit performed for contraband, proper placement and condition of furniture, tampering with fixtures in bathroom, shower, and each patient room: Yes.  ; Findings:  APPEARANCE/BEHAVIOR calm and cooperative NEURO ASSESSMENT Orientation: time, place and person Hallucinations: No.None noted (Hallucinations) Speech: Normal Gait: normal RESPIRATORY ASSESSMENT Normal expansion.  Clear to auscultation.  No rales, rhonchi, or wheezing. CARDIOVASCULAR ASSESSMENT regular rate and rhythm, S1, S2 normal, no murmur, click, rub or gallop GASTROINTESTINAL ASSESSMENT soft, nontender, BS WNL, no r/g EXTREMITIES normal strength, tone, and muscle mass PLAN OF CARE Provide calm/safe environment. Vital signs assessed twice daily. ED BHU Assessment once each 12-hour shift. Collaborate with intake RN daily or as condition indicates. Assure the ED provider has rounded once each shift. Provide and encourage hygiene. Provide redirection as needed. Assess for escalating behavior; address immediately and inform ED provider.  Assess family dynamic and appropriateness for visitation as needed: Yes.  ; If necessary, describe findings:  Educate the patient/family about BHU procedures/visitation: Yes.  ; If necessary, describe findings:

## 2014-08-01 NOTE — ED Notes (Signed)
Purple jacket, white tshirt, white bra, green shorts, brown flipflops and silver purse, silver tone band removed and placed in behav scrubs; pt declines to have any valuables placed in hosp safe; instructed that these belongings will be secured on nursing unit

## 2014-08-01 NOTE — ED Notes (Signed)
Patient assigned to appropriate care area. Patient oriented to unit/care area: Informed that, for their safety, care areas are designed for safety and monitored by security cameras at all times; and visiting hours explained to patient. Patient verbalizes understanding, and verbal contract for safety obtained. 

## 2014-08-01 NOTE — Tx Team (Signed)
Initial Interdisciplinary Treatment Plan   PATIENT STRESSORS: Financial difficulties Marital or family conflict   PATIENT STRENGTHS: Ability for insight Average or above average intelligence Communication skills General fund of knowledge   PROBLEM LIST: Problem List/Patient Goals Date to be addressed Date deferred Reason deferred Estimated date of resolution  Major Depressive Disorder 08/01/14     Suicide 08/01/14                                                DISCHARGE CRITERIA:  Improved stabilization in mood, thinking, and/or behavior Reduction of life-threatening or endangering symptoms to within safe limits  PRELIMINARY DISCHARGE PLAN: Outpatient therapy  PATIENT/FAMIILY INVOLVEMENT: This treatment plan has been presented to and reviewed with the patient, [name], and/or family member, .  The patient and family have been given the opportunity to ask questions and make suggestions.  [name] 08/01/2014, 5:27 PM
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
## 2014-08-02 MED ORDER — HYDROXYZINE HCL 25 MG PO TABS: 25.0000 mg | ORAL_TABLET | Freq: Four times a day (QID) | ORAL | Status: DC | PRN

## 2014-08-02 MED ORDER — QUETIAPINE FUMARATE 25 MG PO TABS: 25.0000 mg | ORAL_TABLET | Freq: Every day | ORAL | Status: DC

## 2014-08-02 MED ORDER — MIRTAZAPINE 15 MG PO TABS: 15.0000 mg | ORAL_TABLET | Freq: Every day | ORAL | Status: DC

## 2014-08-02 NOTE — Progress Notes (Signed)
Recreation Therapy Notes  Date: 07.28.16 Time: 3:00 pm Location: Craft Room  Group Topic: Coping Skills, Leisure Education  Goal Area(s) Addresses:  Patient will identify things they are grateful for. Patient will identify how being grateful can influence decision making.  Behavioral Response: Did not attend  Intervention: Grateful Wheel  Activity: Patients were given an "I Am Grateful For" worksheet and instructed to list at least one thing they are grateful for under each category.   Education: LRT educated patients on why it is important to be grateful.  Education Outcome: Patient did not attend group.  Clinical Observations/Feedback: Patient did not attend group.  Jacquelynn Cree, LRT/CTRS 08/02/2014 4:13 PM

## 2014-08-02 NOTE — [facility] Group Notes (Signed)
[facility] Group Notes:  (Nursing/MHT/Case Management/Adjunct)  Date:  08/02/2014  Time:  2:29 PM  Type of Therapy:  Psychoeducational Skills  Participation Level:  Active  Participation Quality:  Appropriate, Sharing and Supportive  Affect:  Appropriate  Cognitive:  Appropriate  Insight:  Appropriate  Engagement in Group:  Engaged and Supportive  Modes of Intervention:  Education and Support  Summary of Progress/Problems:  Benay Pike 08/02/2014, 2:29 PM

## 2014-08-02 NOTE — Progress Notes (Signed)
D: Patient denies SI/HI/AVH.  Patient affect and mood are depressed.  Patient did attend evening group. Patient visible on the milieu. No distress noted. A: Support and encouragement offered. Scheduled medications given to pt. Q 15 min checks continued for patient safety. R: Patient receptive. Patient remains safe on the unit.   

## 2014-08-02 NOTE — Progress Notes (Addendum)
  [facility] Adult Case Management Discharge Plan :  Will you be returning to the same living situation after discharge:  Yes,  patient will return home At discharge, do you have transportation home?: Yes,  patient's father will pick up Do you have the ability to pay for your medications: Yes,  patient has insurance  Release of information consent forms completed and in the chart;  Patient's signature needed at discharge.  Patient to Follow up at: Follow-up Information    Follow up with [facility]. Go in 1 day.   Why:  For follow-up care; for hospital followup patient appointment Monday 08/06/14 at 9:00am for initial; walkin appointments M-F 9am-4pm; take insurance card and scripts   Contact information:   [address] [place], [place] [phone] Fax [phone]      Patient denies SI/HI: Yes,  patient denies SI/HI    Safety Planning and Suicide Prevention discussed: Yes,  SPE provided to patient and father who states gun is removed from the home and is safe   Have you used any form of tobacco in the last 30 days? (Cigarettes, Smokeless Tobacco, Cigars, and/or Pipes): No  Has patient been referred to the Quitline?: N/A patient is not a smoker  Lulu Riding, MSW, LCSWA 08/02/2014, 2:29 PM

## 2014-08-02 NOTE — [facility] Suicide Risk Assessment (Signed)
[facility] INPATIENT:  Family/Significant Other Suicide Prevention Education  Suicide Prevention Education:  Education Completed; Junior Ernestina Penna (father) [phone] has been identified by the patient as the family member/significant other with whom the patient will be residing, and identified as the person(s) who will aid the patient in the event of a mental health crisis (suicidal ideations/suicide attempt).  With written consent from the patient, the family member/significant other has been provided the following suicide prevention education, prior to the and/or following the discharge of the patient.  The suicide prevention education provided includes the following:  Suicide risk factors  Suicide prevention and interventions  National Suicide Hotline telephone number  [facility] assessment telephone number  [facility] Emergency Assistance 911  [facility] and/or Residential Mobile Crisis Unit telephone number  Request made of family/significant other to:  Remove weapons (e.g., guns, rifles, knives), all items previously/currently identified as safety concern.    Remove drugs/medications (over-the-counter, prescriptions, illicit drugs), all items previously/currently identified as a safety concern.  The family member/significant other verbalizes understanding of the suicide prevention education information provided.  The family member/significant other agrees to remove the items of safety concern listed above.  Lulu Riding, MSW, LCSWA 08/02/2014, 2:29 PM

## 2014-08-02 NOTE — [facility] Group Notes (Signed)
[facility] Group Notes:  (Nursing/MHT/Case Management/Adjunct)  Date:  08/02/2014  Time:  1:56 AM  Type of Therapy:  Psychoeducational Skills  Participation Level:  Active  Participation Quality:  Inattentive  Affect:  Appropriate  Cognitive:  Oriented  Insight:  Good  Engagement in Group:  Distracting and Limited  Modes of Intervention:  Discussion and Exploration  Summary of Progress/Problems:  [name] 08/02/2014, 1:56 AM

## 2014-08-02 NOTE — Progress Notes (Signed)
Patient with appropriate affect and cooperative behavior with meals, meds and plan of care. No SI/HI/AVH at this time. Good appetite and good adls. Verbalizes needs appropriately and verbalizes understanding rt recommended discharge plan of care. Verbalizes to Clinical research associate that all her belongings have been returned. Safety maintained.
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
## 2014-08-02 NOTE — [facility] Suicide Risk Assessment (Signed)
[facility] Discharge Suicide Risk Assessment   Demographic Factors:  Caucasian  Total Time spent with patient: 30 minutes  Musculoskeletal: Strength & Muscle Tone: within normal limits Gait & Station: normal Patient leans: N/A  Psychiatric Specialty Exam: Physical Exam  Nursing note and vitals reviewed.   Review of Systems  Genitourinary: Positive for flank pain.  Musculoskeletal: Positive for myalgias.    Blood pressure 100/69, pulse 87, temperature 97.9 F (36.6 C), temperature source Oral, resp. rate 20, height 5' (1.524 m), weight 61.689 kg (136 lb).Body mass index is 26.56 kg/(m^2).  General Appearance: Casual  Eye Contact::  Good  Speech:  Clear and Coherent409  Volume:  Normal  Mood:  Euthymic  Affect:  Appropriate  Thought Process:  Goal Directed  Orientation:  Full (Time, Place, and Person)  Thought Content:  WDL  Suicidal Thoughts:  No  Homicidal Thoughts:  No  Memory:  Immediate;   Fair Recent;   Fair Remote;   Fair  Judgement:  Fair  Insight:  Fair  Psychomotor Activity:  Normal  Concentration:  Fair  Recall:  [employer] of Knowledge:Fair  Language: Fair  Akathisia:  No  Handed:  Right  AIMS (if indicated):     Assets:  Communication Skills Desire for Improvement Financial Resources/Insurance Housing Social Support  Sleep:  Number of Hours: 5.75  Cognition: WNL  ADL's:  Intact   Have you used any form of tobacco in the last 30 days? (Cigarettes, Smokeless Tobacco, Cigars, and/or Pipes): No  Has this patient used any form of tobacco in the last 30 days? (Cigarettes, Smokeless Tobacco, Cigars, and/or Pipes) No  Mental Status Per Nursing Assessment::   On Admission:  Suicidal ideation indicated by others  Current Mental Status by Physician: NA  Loss Factors: Loss of significant relationship  Historical Factors: NA  Risk Reduction Factors:   Responsible for children under 62 years of age, Sense of responsibility to family, Religious beliefs  about death, Employed, Living with another person, especially a relative, Positive social support and Positive therapeutic relationship  Continued Clinical Symptoms:  Depression:   Severe  Cognitive Features That Contribute To Risk:  None    Suicide Risk:  Minimal: No identifiable suicidal ideation.  Patients presenting with no risk factors but with morbid ruminations; may be classified as minimal risk based on the severity of the depressive symptoms  Principal Problem: Major depressive disorder, recurrent severe without psychotic features Discharge Diagnoses:  Patient Active Problem List   Diagnosis Date Noted  . Major depressive disorder, recurrent severe without psychotic features [F33.2] 08/01/2014      Plan Of Care/Follow-up recommendations:  Activity:  as tolerated. Diet:  low sodium heart healthy. Other:  keep follow up appointments.  Is patient on multiple antipsychotic therapies at discharge:  No   Has Patient had three or more failed trials of antipsychotic monotherapy by history:  No  Recommended Plan for Multiple Antipsychotic Therapies: NA    Bellamarie Pflug 08/02/2014, 11:29 AM

## 2014-08-03 NOTE — Progress Notes (Signed)
AVS H&P Discharge Summary faxed to [facility] for hospital follow-up °

## 2014-08-23 ENCOUNTER — Encounter: Payer: Self-pay | Admitting: Emergency Medicine

## 2014-08-23 ENCOUNTER — Emergency Department
Admission: EM | Admit: 2014-08-23 | Discharge: 2014-08-23 | Disposition: A | Discharge status: Home / Self Care | Payer: BLUE CROSS/BLUE SHIELD | Attending: Emergency Medicine | Admitting: Emergency Medicine

## 2014-08-23 DIAGNOSIS — N390 Urinary tract infection, site not specified: Secondary | ICD-10-CM

## 2014-08-23 DIAGNOSIS — G8929 Other chronic pain: Secondary | ICD-10-CM | POA: Insufficient documentation

## 2014-08-23 DIAGNOSIS — R102 Pelvic and perineal pain unspecified side: Secondary | ICD-10-CM

## 2014-08-23 DIAGNOSIS — Z87442 Personal history of urinary calculi: Secondary | ICD-10-CM | POA: Insufficient documentation

## 2014-08-23 DIAGNOSIS — R109 Unspecified abdominal pain: Secondary | ICD-10-CM | POA: Diagnosis present

## 2014-08-23 HISTORY — DX: Calculus of kidney: N20.0

## 2014-08-23 LAB — URINALYSIS COMPLETE WITH MICROSCOPIC (ARMC ONLY)
BILIRUBIN URINE: NEGATIVE
Bacteria, UA: NONE SEEN
Glucose, UA: NEGATIVE mg/dL
NITRITE: NEGATIVE
Protein, ur: NEGATIVE mg/dL
SPECIFIC GRAVITY, URINE: 1.023 (ref 1.005–1.030)
pH: 5 (ref 5.0–8.0)

## 2014-08-23 MED ORDER — SULFAMETHOXAZOLE-TRIMETHOPRIM 800-160 MG PO TABS: 1.0000 | ORAL_TABLET | Freq: Two times a day (BID) | ORAL | Status: DC

## 2014-08-23 MED ORDER — PHENAZOPYRIDINE HCL 200 MG PO TABS: 200.0000 mg | ORAL_TABLET | Freq: Three times a day (TID) | ORAL | Status: DC | PRN

## 2014-08-23 NOTE — ED Notes (Signed)
Pt has hx of kidney stones, pt states right sided flank pain down to her bladder, pt states urine has been brown in color

## 2014-08-23 NOTE — ED Notes (Signed)
Pt presents with right side flank pain since this am. Pt with hx of kidney stones.

## 2014-08-23 NOTE — ED Provider Notes (Signed)
[facility] Emergency Department Provider Note     Time seen: ----------------------------------------- 4:47 PM on 08/23/2014 -----------------------------------------    I have reviewed the triage vital signs and the nursing notes.   HISTORY  Chief Complaint Flank Pain    HPI [name] is a 39 y.o. female who presents ER for right flank pain since this morning. Patient states she has a historyof kidney stones. She also has a history of interstitial cystitis and takes chronic pain medicine. Patient reports she takes methadone and oxycodone for chronic pain. She has run out of the oxycodone for breakthrough pain. Currently takes methadone and still but does not have anything for breakthrough pain. Patient denies fevers chills, vomiting or diarrhea.   Past Medical History  Diagnosis Date  . Interstitial cystitis   . Endometriosis   . Kidney stones     Patient Active Problem List   Diagnosis Date Noted  . Major depressive disorder, recurrent severe without psychotic features 08/01/2014    Past Surgical History  Procedure Laterality Date  . Appendectomy    . Abdominal hysterectomy      Allergies Phenergan and Stadol  Social History Social History  Substance Use Topics  . Smoking status: Never Smoker   . Smokeless tobacco: None  . Alcohol Use: No    Review of Systems Constitutional: Negative for fever. Eyes: Negative for visual changes. ENT: Negative for sore throat. Cardiovascular: Negative for chest pain. Respiratory: Negative for shortness of breath. Gastrointestinal: Positive for abdominal pain Genitourinary: Negative for dysuria. Musculoskeletal: Negative for back pain. Skin: Negative for rash. Neurological: Negative for headaches, focal weakness or numbness.  10-point ROS otherwise negative.  ____________________________________________   PHYSICAL EXAM:  VITAL SIGNS: ED Triage Vitals  Enc Vitals Group     BP  08/23/14 1600 122/88 mmHg     Pulse Rate 08/23/14 1600 101     Resp 08/23/14 1600 20     Temp 08/23/14 1600 98 F (36.7 C)     Temp Source 08/23/14 1600 Oral     SpO2 08/23/14 1600 100 %     Weight 08/23/14 1600 134 lb (60.782 kg)     Height 08/23/14 1600 5' (1.524 m)     Head Cir --      Peak Flow --      Pain Score 08/23/14 1606 7     Pain Loc --      Pain Edu? --      Excl. in GC? --     Constitutional: Alert and oriented. Well appearing and in no distress. Eyes: Conjunctivae are normal. PERRL. Normal extraocular movements. ENT   Head: Normocephalic and atraumatic.   Nose: No congestion/rhinnorhea.   Mouth/Throat: Mucous membranes are moist.   Neck: No stridor. Cardiovascular: Normal rate, regular rhythm. Normal and symmetric distal pulses are present in all extremities. No murmurs, rubs, or gallops. Respiratory: Normal respiratory effort without tachypnea nor retractions. Breath sounds are clear and equal bilaterally. No wheezes/rales/rhonchi. Gastrointestinal: Right lower quadrant tenderness, no rebound or guarding. Normal bowel sounds. Musculoskeletal: Nontender with normal range of motion in all extremities. No joint effusions.  No lower extremity tenderness nor edema. Neurologic:  Normal speech and language. No gross focal neurologic deficits are appreciated. Speech is normal. No gait instability. Skin:  Skin is warm, dry and intact. No rash noted. Psychiatric: Mood and affect are normal. Speech and behavior are normal. Patient exhibits appropriate insight and judgment. ____________________________________________  ED COURSE:  Pertinent labs & imaging results  that were available during my care of the patient were reviewed by me and considered in my medical decision making (see chart for details). We'll check urinalysis, exam is benign this is likely chronic pain ____________________________________________    LABS (pertinent positives/negatives)  Labs  Reviewed  URINALYSIS COMPLETEWITH MICROSCOPIC ([facility] ONLY)   ____________________________________________  FINAL ASSESSMENT AND PLAN  Pelvic pain, chronic pain management  Plan: Patient with labs and imaging as dictated above. Patient is in no acute distress, she has run out of her oxycodone 2 weeks early. She is stable for outpatient follow-up, advised we cannot refill her narcotics.   Emily Filbert, MD   Emily Filbert, MD 08/23/14 [phone]

## 2014-08-23 NOTE — Discharge Instructions (Signed)
Urinary Tract Infection A urinary tract infection (UTI) can occur any place along the urinary tract. The tract includes the kidneys, ureters, bladder, and urethra. A type of germ called bacteria often causes a UTI. UTIs are often helped with antibiotic medicine.  HOME CARE   If given, take antibiotics as told by your doctor. Finish them even if you start to feel better.  Drink enough fluids to keep your pee (urine) clear or pale yellow.  Avoid tea, drinks with caffeine, and bubbly (carbonated) drinks.  Pee often. Avoid holding your pee in for a long time.  Pee before and after having sex (intercourse).  Wipe from front to back after you poop (bowel movement) if you are a woman. Use each tissue only once. GET HELP RIGHT AWAY IF:   You have back pain.  You have lower belly (abdominal) pain.  You have chills.  You feel sick to your stomach (nauseous).  You throw up (vomit).  Your burning or discomfort with peeing does not go away.  You have a fever.  Your symptoms are not better in 3 days. MAKE SURE YOU:   Understand these instructions.  Will watch your condition.  Will get help right away if you are not doing well or get worse. Document Released: 06/10/2007 Document Revised: 09/16/2011 Document Reviewed: 07/23/2011 [facility] Patient Information 2015 [place], [place]. This information is not intended to replace advice given to you by your health care provider. Make sure you discuss any questions you have with your health care provider. Pelvic Pain Female pelvic pain can be caused by many different things and start from a variety of places. Pelvic pain refers to pain that is located in the lower half of the abdomen and between your hips. The pain may occur over a short period of time (acute) or may be reoccurring (chronic). The cause of pelvic pain may be related to disorders affecting the female reproductive organs (gynecologic), but it may also be related to the bladder,  kidney stones, an intestinal complication, or muscle or skeletal problems. Getting help right away for pelvic pain is important, especially if there has been severe, sharp, or a sudden onset of unusual pain. It is also important to get help right away because some types of pelvic pain can be life threatening.  CAUSES  Below are only some of the causes of pelvic pain. The causes of pelvic pain can be in one of several categories.   Gynecologic.  Pelvic inflammatory disease.  Sexually transmitted infection.  Ovarian cyst or a twisted ovarian ligament (ovarian torsion).  Uterine lining that grows outside the uterus (endometriosis).  Fibroids, cysts, or tumors.  Ovulation.  Pregnancy.  Pregnancy that occurs outside the uterus (ectopic pregnancy).  Miscarriage.  Labor.  Abruption of the placenta or ruptured uterus.  Infection.  Uterine infection (endometritis).  Bladder infection.  Diverticulitis.  Miscarriage related to a uterine infection (septic abortion).  Bladder.  Inflammation of the bladder (cystitis).  Kidney stone(s).  Gastrointestinal.  Constipation.  Diverticulitis.  Neurologic.  Trauma.  Feeling pelvic pain because of mental or emotional causes (psychosomatic).  Cancers of the bowel or pelvis. EVALUATION  Your caregiver will want to take a careful history of your concerns. This includes recent changes in your health, a careful gynecologic history of your periods (menses), and a sexual history. Obtaining your family history and medical history is also important. Your caregiver may suggest a pelvic exam. A pelvic exam will help identify the location and severity of the pain.  It also helps in the evaluation of which organ system may be involved. In order to identify the cause of the pelvic pain and be properly treated, your caregiver may order tests. These tests may include:   A pregnancy test.  Pelvic ultrasonography.  An X-ray exam of the  abdomen.  A urinalysis or evaluation of vaginal discharge.  Blood tests. HOME CARE INSTRUCTIONS   Only take over-the-counter or prescription medicines for pain, discomfort, or fever as directed by your caregiver.   Rest as directed by your caregiver.   Eat a balanced diet.   Drink enough fluids to make your urine clear or pale yellow, or as directed.   Avoid sexual intercourse if it causes pain.   Apply warm or cold compresses to the lower abdomen depending on which one helps the pain.   Avoid stressful situations.   Keep a journal of your pelvic pain. Write down when it started, where the pain is located, and if there are things that seem to be associated with the pain, such as food or your menstrual cycle.  Follow up with your caregiver as directed.  SEEK MEDICAL CARE IF:  Your medicine does not help your pain.  You have abnormal vaginal discharge. SEEK IMMEDIATE MEDICAL CARE IF:   You have heavy bleeding from the vagina.   Your pelvic pain increases.   You feel light-headed or faint.   You have chills.   You have pain with urination or blood in your urine.   You have uncontrolled diarrhea or vomiting.   You have a fever or persistent symptoms for more than 3 days.  You have a fever and your symptoms suddenly get worse.   You are being physically or sexually abused.  MAKE SURE YOU:  Understand these instructions.  Will watch your condition.  Will get help if you are not doing well or get worse. Document Released: 11/19/2003 Document Revised: 05/08/2013 Document Reviewed: 04/13/2011 [facility] Patient Information 2015 [place], [place]. This information is not intended to replace advice given to you by your health care provider. Make sure you discuss any questions you have with your health care provider.

## 2014-08-25 ENCOUNTER — Emergency Department (HOSPITAL_COMMUNITY)
Admission: EM | Admit: 2014-08-25 | Discharge: 2014-08-25 | Disposition: A | Discharge status: Home / Self Care | Payer: BLUE CROSS/BLUE SHIELD | Attending: Emergency Medicine | Admitting: Emergency Medicine

## 2014-08-25 ENCOUNTER — Encounter (HOSPITAL_COMMUNITY): Payer: Self-pay

## 2014-08-25 DIAGNOSIS — R109 Unspecified abdominal pain: Secondary | ICD-10-CM | POA: Insufficient documentation

## 2014-08-25 DIAGNOSIS — Z8742 Personal history of other diseases of the female genital tract: Secondary | ICD-10-CM | POA: Insufficient documentation

## 2014-08-25 DIAGNOSIS — Z79899 Other long term (current) drug therapy: Secondary | ICD-10-CM | POA: Insufficient documentation

## 2014-08-25 DIAGNOSIS — Z87442 Personal history of urinary calculi: Secondary | ICD-10-CM | POA: Diagnosis not present

## 2014-08-25 DIAGNOSIS — Z90710 Acquired absence of both cervix and uterus: Secondary | ICD-10-CM | POA: Diagnosis not present

## 2014-08-25 DIAGNOSIS — Z792 Long term (current) use of antibiotics: Secondary | ICD-10-CM | POA: Diagnosis not present

## 2014-08-25 MED ORDER — ONDANSETRON 8 MG PO TBDP: 8.0000 mg | ORAL_TABLET | Freq: Three times a day (TID) | ORAL | Status: DC | PRN

## 2014-08-25 NOTE — ED Provider Notes (Signed)
CSN: [id number]     Arrival date & time 08/25/14  2205 History   First MD Initiated Contact with Patient 08/25/14 2212     Chief Complaint  Patient presents with  . Flank Pain  . Nephrolithiasis     (Consider location/radiation/quality/duration/timing/severity/associated sxs/prior Treatment) HPI Comments: Pt comes in with cc of kidney stones. She has R flank pain and has hx of multiple stones. She has been to 3 different hospitals over the last few days - being treated as pyelonephritis. Pt has no fevers, chills. Pt has been taking percocets w/o pain relief. She is also on methadone per review. Pt is s/p US of the kidneys - neg for any hydronephrosis.   Patient is a 39 y.o. female presenting with flank pain. The history is provided by the patient.  Flank Pain Pertinent negatives include no chest pain, no abdominal pain, no headaches and no shortness of breath.    Past Medical History  Diagnosis Date  . Interstitial cystitis   . Endometriosis   . Kidney stones    Past Surgical History  Procedure Laterality Date  . Appendectomy    . Abdominal hysterectomy     No family history on file. Social History  Substance Use Topics  . Smoking status: Never Smoker   . Smokeless tobacco: None  . Alcohol Use: No   OB History    No data available     Review of Systems  Constitutional: Positive for activity change.  Respiratory: Negative for shortness of breath.   Cardiovascular: Negative for chest pain.  Gastrointestinal: Negative for nausea, vomiting and abdominal pain.  Genitourinary: Positive for flank pain. Negative for dysuria, frequency, vaginal discharge and pelvic pain.  Musculoskeletal: Negative for neck pain.  Neurological: Negative for headaches.      Allergies  Phenergan and Stadol  Home Medications   Prior to Admission medications   Medication Sig Start Date End Date Taking? Authorizing Provider  clonazePAM (KLONOPIN) 1 MG tablet Take 1 mg by mouth 2 (two)  times daily as needed. 07/22/14   Historical Provider, MD  hydrOXYzine (ATARAX/VISTARIL) 25 MG tablet Take 1 tablet (25 mg total) by mouth every 6 (six) hours as needed for anxiety. 08/02/14   Shari Prows, MD  levothyroxine (SYNTHROID, LEVOTHROID) 50 MCG tablet TAKE 1 TABLET BY MOUTH DAILY 07/30/14   Duanne Limerick, MD  methadone (DOLOPHINE) 10 MG tablet Take 1 tablet by mouth every 4 (four) hours. 07/06/14   Historical Provider, MD  mirtazapine (REMERON) 15 MG tablet Take 1 tablet (15 mg total) by mouth at bedtime. 08/02/14   Shari Prows, MD  ondansetron (ZOFRAN ODT) 8 MG disintegrating tablet Take 1 tablet (8 mg total) by mouth every 8 (eight) hours as needed for nausea. 08/25/14   Derwood Kaplan, MD  Oxycodone HCl 10 MG TABS Take 10 mg by mouth every 6 (six) hours.    Historical Provider, MD  phenazopyridine (PYRIDIUM) 200 MG tablet Take 1 tablet (200 mg total) by mouth 3 (three) times daily as needed for pain. 08/23/14 08/23/15  Emily Filbert, MD  sulfamethoxazole-trimethoprim (BACTRIM DS) 800-160 MG per tablet Take 1 tablet by mouth 2 (two) times daily. 08/23/14   Emily Filbert, MD   BP 130/104 mmHg  Pulse 81  Resp 18  SpO2 100% Physical Exam  Constitutional: She is oriented to person, place, and time. She appears well-developed.  HENT:  Head: Normocephalic and atraumatic.  Eyes: EOM are normal.  Neck: Normal range of motion.  Neck supple.  Cardiovascular: Normal rate.   Pulmonary/Chest: Effort normal.  Abdominal: Bowel sounds are normal. There is no tenderness. There is no guarding.  Genitourinary:  Flank tenderness, R  Neurological: She is alert and oriented to person, place, and time.  Skin: Skin is warm and dry.  Nursing note and vitals reviewed.   ED Course  Procedures (including critical care time) Labs Review Labs Reviewed - No data to display  Imaging Review No results found. I have personally reviewed and evaluated these images and lab results as  part of my medical decision-making.   EKG Interpretation None      MDM   Final diagnoses:  Right flank pain    Pt comes in with cc of R flank pain. The exam is otherwise benign. She has no abnormal vitals and is non toxic and is already on 10 mg percocets and methadone. She doesn't appear in any distress. Discussed the workup already completed at the outside hospital, and my thoughts on how this is not kidney stones. Discussed CT scan is likely not necessary , given the reassuring vitals for this ongoing pain for several days. Pt doesn't think this is pelvic etiology. Advised pcp f/u.     Derwood Kaplan, MD 08/30/14 [phone]

## 2014-08-25 NOTE — ED Notes (Signed)
Patient complains of right side pain and states she has a hx of kidney stones. Was seen on the 18th at [facility]. Patient has taken  Percocet and denies relief. Denies urination difficulty or pain with urination.

## 2014-08-25 NOTE — Discharge Instructions (Signed)
Flank Pain °Flank pain refers to pain that is located on the side of the body between the upper abdomen and the back. The pain may occur over a short period of time (acute) or may be long-term or reoccurring (chronic). It may be mild or severe. Flank pain can be caused by many things. °CAUSES  °Some of the more common causes of flank pain include: °· Muscle strains.   °· Muscle spasms.   °· A disease of your spine (vertebral disk disease).   °· A lung infection (pneumonia).   °· Fluid around your lungs (pulmonary edema).   °· A kidney infection.   °· Kidney stones.   °· A very painful skin rash caused by the chickenpox virus (shingles).   °· Gallbladder disease.   °HOME CARE INSTRUCTIONS  °Home care will depend on the cause of your pain. In general, °· Rest as directed by your caregiver. °· Drink enough fluids to keep your urine clear or pale yellow. °· Only take over-the-counter or prescription medicines as directed by your caregiver. Some medicines may help relieve the pain. °· Tell your caregiver about any changes in your pain. °· Follow up with your caregiver as directed. °SEEK IMMEDIATE MEDICAL CARE IF:  °· Your pain is not controlled with medicine.   °· You have new or worsening symptoms. °· Your pain increases.   °· You have abdominal pain.   °· You have shortness of breath.   °· You have persistent nausea or vomiting.   °· You have swelling in your abdomen.   °· You feel faint or pass out.   °· You have blood in your urine. °· You have a fever or persistent symptoms for more than 2-3 days. °· You have a fever and your symptoms suddenly get worse. °MAKE SURE YOU:  °· Understand these instructions. °· Will watch your condition. °· Will get help right away if you are not doing well or get worse. °Document Released: 02/12/2005 Document Revised: 09/16/2011 Document Reviewed: 08/06/2011 °[employer]® Patient Information ©2015 [employer]. This information is not intended to replace advice given to you by your  health care provider. Make sure you discuss any questions you have with your health care provider. ° °

## 2014-08-25 NOTE — ED Notes (Signed)
Pt states allergy to [place].

## 2014-08-29 ENCOUNTER — Other Ambulatory Visit: Payer: Self-pay | Admitting: Family Medicine

## 2014-08-29 DIAGNOSIS — E039 Hypothyroidism, unspecified: Secondary | ICD-10-CM

## 2014-11-22 ENCOUNTER — Telehealth: Payer: Self-pay | Admitting: Pain Medicine

## 2014-11-22 NOTE — Telephone Encounter (Signed)
[name] , pcp , for patient is willing to write one month script until patient can get in for Dec appt for med refill, we have no Nov appts available  Pcp wants to talk to nurse or phys [phone]

## 2014-11-27 ENCOUNTER — Encounter: Payer: Self-pay | Admitting: Pain Medicine

## 2014-11-27 ENCOUNTER — Other Ambulatory Visit: Payer: Self-pay | Admitting: Pain Medicine

## 2014-11-27 ENCOUNTER — Ambulatory Visit: Payer: BLUE CROSS/BLUE SHIELD | Attending: Pain Medicine | Admitting: Pain Medicine

## 2014-11-27 VITALS — BP 117/83 | HR 105 | Temp 98.3°F | Resp 18 | Ht 60.0 in | Wt 115.0 lb

## 2014-11-27 DIAGNOSIS — M545 Low back pain: Secondary | ICD-10-CM | POA: Diagnosis not present

## 2014-11-27 DIAGNOSIS — E669 Obesity, unspecified: Secondary | ICD-10-CM | POA: Diagnosis not present

## 2014-11-27 DIAGNOSIS — M1990 Unspecified osteoarthritis, unspecified site: Secondary | ICD-10-CM | POA: Insufficient documentation

## 2014-11-27 DIAGNOSIS — R1013 Epigastric pain: Secondary | ICD-10-CM | POA: Diagnosis not present

## 2014-11-27 DIAGNOSIS — F1190 Opioid use, unspecified, uncomplicated: Secondary | ICD-10-CM

## 2014-11-27 DIAGNOSIS — M25551 Pain in right hip: Secondary | ICD-10-CM | POA: Diagnosis not present

## 2014-11-27 DIAGNOSIS — F419 Anxiety disorder, unspecified: Secondary | ICD-10-CM | POA: Insufficient documentation

## 2014-11-27 DIAGNOSIS — F329 Major depressive disorder, single episode, unspecified: Secondary | ICD-10-CM | POA: Diagnosis not present

## 2014-11-27 DIAGNOSIS — M549 Dorsalgia, unspecified: Secondary | ICD-10-CM | POA: Diagnosis present

## 2014-11-27 DIAGNOSIS — G43909 Migraine, unspecified, not intractable, without status migrainosus: Secondary | ICD-10-CM | POA: Diagnosis not present

## 2014-11-27 DIAGNOSIS — M150 Primary generalized (osteo)arthritis: Secondary | ICD-10-CM

## 2014-11-27 DIAGNOSIS — N949 Unspecified condition associated with female genital organs and menstrual cycle: Secondary | ICD-10-CM

## 2014-11-27 DIAGNOSIS — R109 Unspecified abdominal pain: Secondary | ICD-10-CM | POA: Insufficient documentation

## 2014-11-27 DIAGNOSIS — Z79899 Other long term (current) drug therapy: Secondary | ICD-10-CM

## 2014-11-27 DIAGNOSIS — F1121 Opioid dependence, in remission: Secondary | ICD-10-CM

## 2014-11-27 DIAGNOSIS — Z8669 Personal history of other diseases of the nervous system and sense organs: Secondary | ICD-10-CM

## 2014-11-27 DIAGNOSIS — Z79891 Long term (current) use of opiate analgesic: Secondary | ICD-10-CM

## 2014-11-27 DIAGNOSIS — G8929 Other chronic pain: Secondary | ICD-10-CM

## 2014-11-27 DIAGNOSIS — Z5181 Encounter for therapeutic drug level monitoring: Secondary | ICD-10-CM

## 2014-11-27 DIAGNOSIS — E66811 Obesity, class 1: Secondary | ICD-10-CM | POA: Insufficient documentation

## 2014-11-27 DIAGNOSIS — N3010 Interstitial cystitis (chronic) without hematuria: Secondary | ICD-10-CM | POA: Diagnosis not present

## 2014-11-27 DIAGNOSIS — F1120 Opioid dependence, uncomplicated: Secondary | ICD-10-CM | POA: Diagnosis not present

## 2014-11-27 DIAGNOSIS — Z87898 Personal history of other specified conditions: Secondary | ICD-10-CM

## 2014-11-27 DIAGNOSIS — M159 Polyosteoarthritis, unspecified: Secondary | ICD-10-CM

## 2014-11-27 DIAGNOSIS — F1191 Opioid use, unspecified, in remission: Secondary | ICD-10-CM

## 2014-11-27 DIAGNOSIS — R52 Pain, unspecified: Secondary | ICD-10-CM

## 2014-11-27 DIAGNOSIS — Z7189 Other specified counseling: Secondary | ICD-10-CM

## 2014-11-27 DIAGNOSIS — R102 Pelvic and perineal pain: Secondary | ICD-10-CM | POA: Diagnosis not present

## 2014-11-27 MED ORDER — OXYCODONE HCL 10 MG PO TABS
10.0000 mg | ORAL_TABLET | Freq: Four times a day (QID) | ORAL | Status: DC
Start: 2014-11-27 — End: 2015-01-25

## 2014-11-27 MED ORDER — METHADONE HCL 10 MG PO TABS: 10.0000 mg | ORAL_TABLET | Freq: Four times a day (QID) | ORAL | Status: DC | PRN

## 2014-11-27 MED ORDER — OXYCODONE HCL 10 MG PO TABS: 10.0000 mg | ORAL_TABLET | Freq: Four times a day (QID) | ORAL | Status: DC | PRN

## 2014-11-27 NOTE — Progress Notes (Signed)
Patient's Name: [name] MRN: [id number] DOB: March 12, 1975 DOS: 11/27/2014  Primary Reason(s) for Visit: Encounter for Medication Management CC: Back Pain   HPI:   [name] is a 39 y.o. year old, female patient, who returns today as an established patient. She has Major depressive disorder, recurrent severe without psychotic features (HCC); Chronic pain; Anxiety state; Continuous opioid dependence (HCC); Cannot sleep; Affective disorder (HCC); Calculus of kidney; Long term current use of opiate analgesic; Long term prescription opiate use; Opiate use; Encounter for therapeutic drug level monitoring; Encounter for chronic pain management; Chronic low back pain; Osteoarthrosis; Episodic mood disorder (HCC); Opioid dependence (HCC); Methadone dependence (HCC); Methadone use (HCC); History of methadone use (HCC); Chronic abdominal pain; Pain due to interstitial cystitis; Chronic interstitial cystitis; Chronic female pelvic pain; Chronic right hip pain; Obesity, Class I, BMI 30-34.9 (68% higher incidence of chronic low back pain); History of migraine; and Encounter for long-term (current) use of other high-risk medications on her problem list.. Her primarily concern today is the Back Pain     Patient indicates doing well on her medication regimen and not needing any changes.  Today's Pain Score: 6 . Clinically she looks like a 1-2/10. Reported level of pain is incompatible with clinical obrservations. This may be secondary to a possible lack of understanding on how the pain scale works. Pain Type: Chronic pain Pain Location: Back Pain Orientation: Lower Pain Descriptors / Indicators: Constant Pain Frequency: Constant  Pharmacotherapy Review:   Side-effects or Adverse reactions: None reported. Effectiveness: Described as relatively effective, allowing for increase in activities of daily living (ADL). Onset of action: Within expected pharmacological parameters. Duration of action: Within  normal limits for medication. Peak effect: Timing and results are as within normal expected parameters. [place] PMP: Compliant with practice rules and regulations. UDS Results:  Lab Results  Component Value Date   THCU NONE DETECTED 08/01/2014   PCPSCRNUR NONE DETECTED 08/01/2014   MDMA NONE DETECTED 08/01/2014   AMPHETMU NONE DETECTED 08/01/2014   METHADONE POSITIVE* 08/01/2014    UDS Interpretation: Patient appears to be compliant with practice rules and regulations. Medication Assessment Form: Reviewed. Patient indicates being compliant with therapy Treatment compliance: Compliant. Substance Use Disorder (SUD) Risk Level: Low Pharmacologic Plan: Continue therapy as is.  Last Available Lab Work: No visits with results within 3 Month(s) from this visit. Latest known visit with results is:  Admission on 08/23/2014, Discharged on 08/23/2014  Component Date Value Ref Range Status  . Color, Urine 08/23/2014 YELLOW* YELLOW Final  . APPearance 08/23/2014 CLEAR* CLEAR Final  . Glucose, UA 08/23/2014 NEGATIVE  NEGATIVE mg/dL Final  . Bilirubin Urine 08/23/2014 NEGATIVE  NEGATIVE Final  . Ketones, ur 08/23/2014 1+* NEGATIVE mg/dL Final  . Specific Gravity, Urine 08/23/2014 1.023  1.005 - 1.030 Final  . Hgb urine dipstick 08/23/2014 2+* NEGATIVE Final  . pH 08/23/2014 5.0  5.0 - 8.0 Final  . Protein, ur 08/23/2014 NEGATIVE  NEGATIVE mg/dL Final  . Nitrite 40/98/1191 NEGATIVE  NEGATIVE Final  . Leukocytes, UA 08/23/2014 1+* NEGATIVE Final  . RBC / HPF 08/23/2014 6-30  0 - 5 RBC/hpf Final  . WBC, UA 08/23/2014 6-30  0 - 5 WBC/hpf Final  . Bacteria, UA 08/23/2014 NONE SEEN  NONE SEEN Final  . Squamous Epithelial / LPF 08/23/2014 0-5* NONE SEEN Final  . Mucous 08/23/2014 PRESENT   Final   Allergies: [name] is allergic to phenergan; stadol; topamax; and sulfa antibiotics.  Meds: The patient has a current medication  list which includes the following prescription(s): clonazepam,  levothyroxine, methadone, methadone, methadone, mirtazapine, ondansetron, oxycodone hcl, hydroxyzine, oxycodone hcl, oxycodone hcl, phenazopyridine, and sulfamethoxazole-trimethoprim. Requested Prescriptions   Signed Prescriptions Disp Refills  . methadone (DOLOPHINE) 10 MG tablet 120 tablet 0    Sig: Take 1 tablet (10 mg total) by mouth every 6 (six) hours as needed for severe pain.  . Oxycodone HCl 10 MG TABS 120 tablet 0    Sig: Take 1 tablet (10 mg total) by mouth every 6 (six) hours.  . methadone (DOLOPHINE) 10 MG tablet 120 tablet 0    Sig: Take 1 tablet (10 mg total) by mouth every 6 (six) hours as needed for moderate pain or severe pain.  . methadone (DOLOPHINE) 10 MG tablet 120 tablet 0    Sig: Take 1 tablet (10 mg total) by mouth every 6 (six) hours as needed for moderate pain or severe pain.  . Oxycodone HCl 10 MG TABS 120 tablet 0    Sig: Take 1 tablet (10 mg total) by mouth every 6 (six) hours as needed.  . Oxycodone HCl 10 MG TABS 120 tablet 0    Sig: Take 1 tablet (10 mg total) by mouth every 6 (six) hours as needed.    ROS: Constitutional: Afebrile, no chills, well hydrated and well nourished Gastrointestinal: negative Musculoskeletal:negative Neurological: negative Behavioral/Psych: negative  PFSH: Medical:  [name]  has a past medical history of Interstitial cystitis; Endometriosis; Kidney stones; LBP (low back pain) (05/23/2013); Ache in joint (02/04/2013); and Addiction, opium (HCC) (01/28/2012). Family: family history is not on file. Surgical:  has past surgical history that includes Appendectomy; Abdominal hysterectomy; Cesarean section; carpal tunnel; and Breast enhancement surgery. Tobacco:  reports that she has never smoked. She does not have any smokeless tobacco history on file. Alcohol:  reports that she does not drink alcohol. Drug:  has no drug history on file.  Physical Exam: Vitals:  Today's Vitals   11/27/14 1052 11/27/14 1054  BP: 117/83    Pulse: 105   Temp: 98.3 F (36.8 C)   TempSrc: Oral   Resp: 18   Height: 5' (1.524 m)   Weight: 115 lb (52.164 kg)   SpO2: 100%   PainSc:  6   Calculated BMI: Body mass index is 22.46 kg/(m^2). General appearance: alert, cooperative, appears stated age, no distress and mildly obese Eyes: conjunctivae/corneas clear. PERRL, EOM's intact. Fundi benign. Lungs: No evidence respiratory distress, no audible rales or ronchi and no use of accessory muscles of respiration Neck: no adenopathy, no carotid bruit, no JVD, supple, symmetrical, trachea midline and thyroid not enlarged, symmetric, no tenderness/mass/nodules Back: symmetric, no curvature. ROM normal. No CVA tenderness. Extremities: extremities normal, atraumatic, no cyanosis or edema Pulses: 2+ and symmetric Skin: Skin color, texture, turgor normal. No rashes or lesions Neurologic: Grossly normal    Assessment: Encounter Diagnosis:  Primary Diagnosis: Chronic pain [G89.29]  Plan: Destinie was seen today for back pain.  Diagnoses and all orders for this visit:  Chronic pain -     COMPLETE METABOLIC PANEL WITH GFR; Future -     C-reactive protein; Future -     Magnesium; Future -     Sedimentation rate; Future -     Vitamin D2,D3 Panel; Future -     methadone (DOLOPHINE) 10 MG tablet; Take 1 tablet (10 mg total) by mouth every 6 (six) hours as needed for severe pain. -     Oxycodone HCl 10 MG TABS; Take 1 tablet (  10 mg total) by mouth every 6 (six) hours. -     methadone (DOLOPHINE) 10 MG tablet; Take 1 tablet (10 mg total) by mouth every 6 (six) hours as needed for moderate pain or severe pain. -     methadone (DOLOPHINE) 10 MG tablet; Take 1 tablet (10 mg total) by mouth every 6 (six) hours as needed for moderate pain or severe pain. -     Oxycodone HCl 10 MG TABS; Take 1 tablet (10 mg total) by mouth every 6 (six) hours as needed. -     Oxycodone HCl 10 MG TABS; Take 1 tablet (10 mg total) by mouth every 6 (six) hours as  needed.  Methadone dependence (HCC)  Methadone use (HCC) -     EKG 12-Lead; Standing  History of methadone use (HCC)  Chronic abdominal pain  Pain due to interstitial cystitis  Chronic interstitial cystitis  Chronic female pelvic pain  Chronic low back pain  Primary osteoarthritis involving multiple joints  Continuous opioid dependence (HCC)  Encounter for chronic pain management  Encounter for therapeutic drug level monitoring  Long term current use of opiate analgesic -     Drugs of abuse screen w/o alc, rtn urine-sln -     Drugs of abuse screen w/o alc, rtn urine-sln; Future  Long term prescription opiate use  Uncomplicated opioid dependence (HCC)  Chronic right hip pain  Obesity, Class I, BMI 30-34.9 (68% higher incidence of chronic low back pain)  History of migraine  Encounter for long-term (current) use of other high-risk medications     There are no Patient Instructions on file for this visit. Medications discontinued today:  Medications Discontinued During This Encounter  Medication Reason  . methadone (DOLOPHINE) 10 MG tablet Reorder  . Oxycodone HCl 10 MG TABS Reorder   Medications administered today:  Ms. [name] had no medications administered during this visit.  Primary Care Physician: Domenic SchwabLindley,Cheryl Paulette, FNP [facility] Note by: Sydnee LevansFrancisco A. Laban EmperorNaveira, M.D, DABA, DABAPM, DABPM, DABIPP, FIPP

## 2014-11-27 NOTE — Telephone Encounter (Signed)
Patient seen today 11/27/2014. Problem taken care of.

## 2014-11-27 NOTE — Progress Notes (Signed)
Safety precautions to be maintained throughout the outpatient stay will include: orient to surroundings, keep bed in low position, maintain call bell within reach at all times, provide assistance with transfer out of bed and ambulation.  Pills remain 0/120 oxycodone 10mg  filled 10/31/14, few pills  in pill contaioner of methadone10mg 

## 2014-12-06 ENCOUNTER — Encounter: Payer: Self-pay | Admitting: Pain Medicine

## 2014-12-07 LAB — TOXASSURE SELECT 13 (MW), URINE: PDF: 0

## 2015-01-25 ENCOUNTER — Other Ambulatory Visit: Payer: Self-pay

## 2015-01-25 ENCOUNTER — Encounter: Payer: Self-pay | Admitting: *Deleted

## 2015-01-25 NOTE — Patient Instructions (Signed)
  Your procedure is scheduled on: 01-28-15 Report to [facility] 2ND FLOOR To find out your arrival time please call [phone] between 1PM - 3PM on 01-25-15  Remember: Instructions that are not followed completely may result in serious medical risk, up to and including death, or upon the discretion of your surgeon and anesthesiologist your surgery may need to be rescheduled.    _X___ 1. Do not eat food or drink liquids after midnight. No gum chewing or hard candies.     _X___ 2. No Alcohol for 24 hours before or after surgery.   ____ 3. Bring all medications with you on the day of surgery if instructed.    _X___ 4. Notify your doctor if there is any change in your medical condition     (cold, fever, infections).     Do not wear jewelry, make-up, hairpins, clips or nail polish.  Do not wear lotions, powders, or perfumes. You may wear deodorant.  Do not shave 48 hours prior to surgery. Men may shave face and neck.  Do not bring valuables to the hospital.    [facility] is not responsible for any belongings or valuables.               Contacts, dentures or bridgework may not be worn into surgery.  Leave your suitcase in the car. After surgery it may be brought to your room.  For patients admitted to the hospital, discharge time is determined by your treatment team.   Patients discharged the day of surgery will not be allowed to drive home.   Please read over the following fact sheets that you were given:      ____ Take these medicines the morning of surgery with A SIP OF WATER:    1. MAY TAKE OXYCODONE AM OF SURGERY IF NEEDED  2.   3.   4.  5.  6.  ____ Fleet Enema (as directed)   ____ Use CHG Soap as directed  ____ Use inhalers on the day of surgery  ____ Stop metformin 2 days prior to surgery    ____ Take 1/2 of usual insulin dose the night before surgery and none on the morning of surgery.   ____ Stop Coumadin/Plavix/aspirin-N/A  ____ Stop  Anti-inflammatories-NO NSAIDS OR ASA PRODUCTS-OXYCODONE OR TYLENOL OK TO TAKE   ____ Stop supplements until after surgery.    ____ Bring C-Pap to the hospital.

## 2015-01-28 ENCOUNTER — Ambulatory Visit
Admission: RE | Admit: 2015-01-28 | Discharge: 2015-01-28 | Disposition: A | Discharge status: Home / Self Care | Payer: BLUE CROSS/BLUE SHIELD | Source: Ambulatory Visit | Attending: Specialist | Admitting: Specialist

## 2015-01-28 ENCOUNTER — Encounter
Admission: RE | Disposition: A | Discharge status: Home / Self Care | Payer: Self-pay | Source: Ambulatory Visit | Attending: Specialist

## 2015-01-28 ENCOUNTER — Ambulatory Visit: Payer: BLUE CROSS/BLUE SHIELD | Admitting: *Deleted

## 2015-01-28 ENCOUNTER — Encounter: Payer: Self-pay | Admitting: *Deleted

## 2015-01-28 DIAGNOSIS — M1991 Primary osteoarthritis, unspecified site: Secondary | ICD-10-CM | POA: Insufficient documentation

## 2015-01-28 DIAGNOSIS — F329 Major depressive disorder, single episode, unspecified: Secondary | ICD-10-CM | POA: Diagnosis not present

## 2015-01-28 DIAGNOSIS — G5602 Carpal tunnel syndrome, left upper limb: Secondary | ICD-10-CM | POA: Insufficient documentation

## 2015-01-28 DIAGNOSIS — Z79899 Other long term (current) drug therapy: Secondary | ICD-10-CM | POA: Diagnosis not present

## 2015-01-28 HISTORY — DX: Hypothyroidism, unspecified: E03.9

## 2015-01-28 HISTORY — DX: Nausea with vomiting, unspecified: R11.2

## 2015-01-28 HISTORY — DX: Other specified postprocedural states: Z98.890

## 2015-01-28 HISTORY — PX: CARPAL TUNNEL RELEASE: SHX101

## 2015-01-28 HISTORY — DX: Other complications of anesthesia, initial encounter: T88.59XA

## 2015-01-28 HISTORY — DX: Essential (primary) hypertension: I10

## 2015-01-28 HISTORY — DX: Adverse effect of unspecified anesthetic, initial encounter: T41.45XA

## 2015-01-28 LAB — SURGICAL PCR SCREEN
MRSA, PCR: NEGATIVE
Staphylococcus aureus: NEGATIVE

## 2015-01-28 SURGERY — CARPAL TUNNEL RELEASE
Anesthesia: General | Site: Wrist | Laterality: Left | Wound class: Clean

## 2015-01-28 MED ORDER — HYDROMORPHONE HCL 1 MG/ML IJ SOLN
INTRAMUSCULAR | Status: AC
  Administered 2015-01-28: 0.5 mg via INTRAVENOUS
  Filled 2015-01-28: qty 1

## 2015-01-28 MED ORDER — GABAPENTIN 400 MG PO CAPS
ORAL_CAPSULE | ORAL | Status: AC
  Administered 2015-01-28: 400 mg via ORAL
  Filled 2015-01-28: qty 1

## 2015-01-28 MED ORDER — PROPOFOL 10 MG/ML IV BOLUS
INTRAVENOUS | Status: DC | PRN
  Administered 2015-01-28: 50 mg via INTRAVENOUS
  Administered 2015-01-28: 160 mg via INTRAVENOUS

## 2015-01-28 MED ORDER — LACTATED RINGERS IV SOLN
INTRAVENOUS | Status: DC
  Administered 2015-01-28: 13:00:00 via INTRAVENOUS

## 2015-01-28 MED ORDER — MELOXICAM 7.5 MG PO TABS
15.0000 mg | ORAL_TABLET | Freq: Once | ORAL | Status: AC
  Administered 2015-01-28: 15 mg via ORAL

## 2015-01-28 MED ORDER — FENTANYL CITRATE (PF) 100 MCG/2ML IJ SOLN
INTRAMUSCULAR | Status: AC
  Administered 2015-01-28: 25 ug via INTRAVENOUS
  Filled 2015-01-28: qty 2

## 2015-01-28 MED ORDER — CEFAZOLIN SODIUM-DEXTROSE 2-3 GM-% IV SOLR
INTRAVENOUS | Status: AC
  Filled 2015-01-28: qty 50

## 2015-01-28 MED ORDER — GABAPENTIN 400 MG PO CAPS: 400.0000 mg | ORAL_CAPSULE | Freq: Three times a day (TID) | ORAL | Status: DC

## 2015-01-28 MED ORDER — GABAPENTIN 400 MG PO CAPS
400.0000 mg | ORAL_CAPSULE | Freq: Once | ORAL | Status: AC
  Administered 2015-01-28: 400 mg via ORAL

## 2015-01-28 MED ORDER — HYDROMORPHONE HCL 1 MG/ML IJ SOLN: 0.2500 mg | INTRAMUSCULAR | Status: DC | PRN

## 2015-01-28 MED ORDER — FENTANYL CITRATE (PF) 100 MCG/2ML IJ SOLN
INTRAMUSCULAR | Status: DC | PRN
  Administered 2015-01-28: 100 ug via INTRAVENOUS

## 2015-01-28 MED ORDER — FAMOTIDINE 20 MG PO TABS
20.0000 mg | ORAL_TABLET | Freq: Once | ORAL | Status: AC
  Administered 2015-01-28: 20 mg via ORAL

## 2015-01-28 MED ORDER — BUPIVACAINE HCL (PF) 0.5 % IJ SOLN
INTRAMUSCULAR | Status: AC
  Filled 2015-01-28: qty 30

## 2015-01-28 MED ORDER — BUPIVACAINE HCL 0.5 % IJ SOLN
INTRAMUSCULAR | Status: DC | PRN
  Administered 2015-01-28: 30 mL

## 2015-01-28 MED ORDER — ACETAMINOPHEN 10 MG/ML IV SOLN
INTRAVENOUS | Status: AC
  Filled 2015-01-28: qty 100

## 2015-01-28 MED ORDER — KETOROLAC TROMETHAMINE 30 MG/ML IJ SOLN
INTRAMUSCULAR | Status: DC | PRN
  Administered 2015-01-28: 30 mg via INTRAVENOUS

## 2015-01-28 MED ORDER — ONDANSETRON HCL 4 MG/2ML IJ SOLN
INTRAMUSCULAR | Status: DC | PRN
  Administered 2015-01-28: 8 mg via INTRAVENOUS

## 2015-01-28 MED ORDER — CEFAZOLIN SODIUM-DEXTROSE 2-3 GM-% IV SOLR
2.0000 g | Freq: Once | INTRAVENOUS | Status: AC
  Administered 2015-01-28: 2 g via INTRAVENOUS

## 2015-01-28 MED ORDER — HYDROMORPHONE HCL 1 MG/ML IJ SOLN
0.2500 mg | INTRAMUSCULAR | Status: DC | PRN
  Administered 2015-01-28 (×4): 0.5 mg via INTRAVENOUS

## 2015-01-28 MED ORDER — MELOXICAM 7.5 MG PO TABS
ORAL_TABLET | ORAL | Status: AC
Start: 2015-01-28 — End: 2015-01-28
  Administered 2015-01-28: 15 mg via ORAL
  Filled 2015-01-28: qty 2

## 2015-01-28 MED ORDER — HYDROCODONE-ACETAMINOPHEN 5-325 MG PO TABS: 1.0000 | ORAL_TABLET | Freq: Four times a day (QID) | ORAL | Status: DC | PRN

## 2015-01-28 MED ORDER — MELOXICAM 15 MG PO TABS: 15.0000 mg | ORAL_TABLET | Freq: Every day | ORAL | Status: DC

## 2015-01-28 MED ORDER — MIDAZOLAM HCL 2 MG/2ML IJ SOLN
INTRAMUSCULAR | Status: DC | PRN
  Administered 2015-01-28: 2 mg via INTRAVENOUS

## 2015-01-28 MED ORDER — LIDOCAINE HCL (CARDIAC) 20 MG/ML IV SOLN
INTRAVENOUS | Status: DC | PRN
  Administered 2015-01-28: 100 mg via INTRAVENOUS

## 2015-01-28 MED ORDER — ACETAMINOPHEN 10 MG/ML IV SOLN
INTRAVENOUS | Status: DC | PRN
  Administered 2015-01-28: 1000 mg via INTRAVENOUS

## 2015-01-28 MED ORDER — DEXAMETHASONE SODIUM PHOSPHATE 10 MG/ML IJ SOLN
INTRAMUSCULAR | Status: DC | PRN
  Administered 2015-01-28: 10 mg via INTRAVENOUS

## 2015-01-28 MED ORDER — HYDROCODONE-ACETAMINOPHEN 5-325 MG PO TABS
ORAL_TABLET | ORAL | Status: AC
  Filled 2015-01-28: qty 1

## 2015-01-28 MED ORDER — FAMOTIDINE 20 MG PO TABS
ORAL_TABLET | ORAL | Status: AC
  Administered 2015-01-28: 20 mg via ORAL
  Filled 2015-01-28: qty 1

## 2015-01-28 MED ORDER — FENTANYL CITRATE (PF) 100 MCG/2ML IJ SOLN
25.0000 ug | INTRAMUSCULAR | Status: DC | PRN
  Administered 2015-01-28 (×4): 25 ug via INTRAVENOUS

## 2015-01-28 MED ORDER — ONDANSETRON HCL 4 MG/2ML IJ SOLN: 4.0000 mg | Freq: Once | INTRAMUSCULAR | Status: DC | PRN

## 2015-01-28 MED ORDER — HYDROCODONE-ACETAMINOPHEN 5-325 MG PO TABS
1.0000 | ORAL_TABLET | Freq: Four times a day (QID) | ORAL | Status: DC | PRN
  Administered 2015-01-28: 1 via ORAL

## 2015-01-28 SURGICAL SUPPLY — 22 items
BLADE SURG MINI STRL (BLADE) ×3 IMPLANT
BNDG ESMARK 4X12 TAN STRL LF (GAUZE/BANDAGES/DRESSINGS) ×3 IMPLANT
CANISTER SUCT 1200ML W/VALVE (MISCELLANEOUS) ×3 IMPLANT
CHLORAPREP W/TINT 26ML (MISCELLANEOUS) ×3 IMPLANT
ELECT REM PT RETURN 9FT ADLT (ELECTROSURGICAL) ×3
ELECTRODE REM PT RTRN 9FT ADLT (ELECTROSURGICAL) ×1 IMPLANT
GAUZE FLUFF 18X24 1PLY STRL (GAUZE/BANDAGES/DRESSINGS) ×3 IMPLANT
GAUZE PETRO XEROFOAM 1X8 (MISCELLANEOUS) ×3 IMPLANT
GLOVE BIO SURGEON STRL SZ8 (GLOVE) ×3 IMPLANT
GOWN STRL REUS W/ TWL LRG LVL3 (GOWN DISPOSABLE) ×2 IMPLANT
GOWN STRL REUS W/TWL LRG LVL3 (GOWN DISPOSABLE) ×4
KIT RM TURNOVER STRD PROC AR (KITS) ×3 IMPLANT
NS IRRIG 500ML POUR BTL (IV SOLUTION) ×3 IMPLANT
PACK EXTREMITY ARMC (MISCELLANEOUS) ×3 IMPLANT
PAD PREP 24X41 OB/GYN DISP (PERSONAL CARE ITEMS) ×3 IMPLANT
SPLINT CAST 1 STEP 3X12 (MISCELLANEOUS) ×3 IMPLANT
STOCKINETTE BIAS CUT 4 980044 (GAUZE/BANDAGES/DRESSINGS) ×3 IMPLANT
STOCKINETTE STRL 4IN 9604848 (GAUZE/BANDAGES/DRESSINGS) ×3 IMPLANT
SUT ETHILON 4-0 (SUTURE) ×2
SUT ETHILON 4-0 FS2 18XMFL BLK (SUTURE) ×1
SUT ETHILON 5-0 FS-2 18 BLK (SUTURE) ×3 IMPLANT
SUTURE ETHLN 4-0 FS2 18XMF BLK (SUTURE) ×1 IMPLANT

## 2015-01-28 NOTE — Anesthesia Postprocedure Evaluation (Signed)
Anesthesia Post Note  Patient: [name]  Procedure(s) Performed: Procedure(s) (LRB): CARPAL TUNNEL RELEASE (Left)  Patient location during evaluation: PACU Anesthesia Type: General Level of consciousness: awake Pain management: pain level controlled Vital Signs Assessment: post-procedure vital signs reviewed and stable Respiratory status: spontaneous breathing Cardiovascular status: blood pressure returned to baseline Postop Assessment: no headache Anesthetic complications: no    Last Vitals:  Filed Vitals:   01/28/15 1338 01/28/15 1343  BP:    Pulse: 84 77  Temp:    Resp: 12 17    Last Pain:  Filed Vitals:   01/28/15 1343  PainSc: 6                  Monica Codd M

## 2015-01-28 NOTE — Progress Notes (Signed)
Blood pressure 134/102  Dr rice called   No new orders

## 2015-01-28 NOTE — Op Note (Signed)
01/28/2015  1:17 PM  PATIENT:  [name]    PRE-OPERATIVE DIAGNOSIS: LEFT CARPAL TUNNEL SYNDROME POST-OPERATIVE DIAGNOSIS: LEFT CARPAL TUNNEL SYNDROME  PROCEDURE:  LEFT CARPAL TUNNEL RELEASE  SURGEON: Valinda Hoar, MD  TOURNIQUET TIME: 15 min    ANESTHESIA:   General  PREOPERATIVE INDICATIONS:  [name] is a  40 y.o. female with a diagnosis of right carpal tunnel syndrome who failed conservative measures and elected for surgical management.    The risks benefits and alternatives were discussed with the patient preoperatively including but not limited to the risks of infection, bleeding, nerve injury, incomplete relief of symptoms, pillar pain, cardiopulmonary complications, the need for revision surgery, among others, and the patient was willing to proceed.  OPERATIVE FINDINGS: Thickened volar ligament and nerve compression.  OPERATIVE PROCEDURE: The patient is brought to the operating room placed in the supine position. General anesthesia was administered. The left upper extremity was prepped and draped in usual sterile fashion. Time out was performed. The arm was elevated and exsanguinated and the tourniquet was inflated. Incision was made in line with the radial border of the ring finger. The carpal tunnel transverse fascia was identified, cleaned, and incised sharply. The common sensory branches were visualized along with the superficial palmar arch and protected.  The median nerve was protected below. Deep retractors were placed underneath the transverse carpal ligament, protecting the nerve. I released the ligament completely, and then released the proximal distal volar forearm fascia. The nerve was identified, and visualized, and protected throughout the case. No masses or abnormalities were identified in ulnar bursa.  The wounds were irrigated copiously, and the wounds injected, and the skin closed with nylon followed by a volar splint and sterile gauze. Sponge and  needle counts were correct.  The patient tolerated this well, with no complications. Awakened and taken to recovery in good condition.

## 2015-01-28 NOTE — Anesthesia Preprocedure Evaluation (Signed)
Anesthesia Evaluation  Patient identified by MRN, date of birth, ID band  Reviewed: Allergy & Precautions, NPO status , Patient's Chart, lab work & pertinent test results  History of Anesthesia Complications (+) PONV  Airway Mallampati: II  TM Distance: >3 FB     Dental  (+) Teeth Intact   Pulmonary    Pulmonary exam normal        Cardiovascular Exercise Tolerance: Good hypertension, Normal cardiovascular exam     Neuro/Psych  Headaches, Anxiety Depression    GI/Hepatic Occasional heartburn.   Endo/Other    Renal/GU Renal diseaseHx of stones.     Musculoskeletal  (+) Arthritis , Osteoarthritis,    Abdominal Normal abdominal exam  (+)   Peds  Hematology   Anesthesia Other Findings   Reproductive/Obstetrics                             Anesthesia Physical Anesthesia Plan  ASA: II  Anesthesia Plan: General   Post-op Pain Management:    Induction: Intravenous  Airway Management Planned: LMA  Additional Equipment:   Intra-op Plan:   Post-operative Plan: Extubation in OR  Informed Consent: I have reviewed the patients History and Physical, chart, labs and discussed the procedure including the risks, benefits and alternatives for the proposed anesthesia with the patient or authorized representative who has indicated his/her understanding and acceptance.   Dental advisory given  Plan Discussed with:   Anesthesia Plan Comments:         Anesthesia Quick Evaluation

## 2015-01-28 NOTE — H&P (Signed)
THE PATIENT WAS SEEN IN THE HOLDING AREA.  HISTORY, ALLERGIES, HOME MEDICATIONS AND OPERATIVE PROCEDURE WERE REVIEWED. RISKS AND BENEFITS OF SURGERY DISCUSSED WITH PATIENT AGAIN.  NO CHANGES FROM INITIAL HISTORY AND PHYSICAL NOTED.    

## 2015-01-28 NOTE — Discharge Instructions (Signed)

## 2015-01-28 NOTE — Anesthesia Procedure Notes (Signed)
Procedure Name: LMA Insertion Date/Time: 01/28/2015 12:49 PM Performed by: Junious Silk Pre-anesthesia Checklist: Patient identified, Patient being monitored, Timeout performed, Emergency Drugs available and Suction available Patient Re-evaluated:Patient Re-evaluated prior to inductionOxygen Delivery Method: Circle system utilized Preoxygenation: Pre-oxygenation with 100% oxygen Intubation Type: IV induction Ventilation: Mask ventilation without difficulty LMA: LMA inserted LMA Size: 3.5 Tube type: Oral Number of attempts: 1 Placement Confirmation: positive ETCO2 and breath sounds checked- equal and bilateral Tube secured with: Tape Dental Injury: Teeth and Oropharynx as per pre-operative assessment

## 2015-01-28 NOTE — Transfer of Care (Signed)
Immediate Anesthesia Transfer of Care Note  Patient: [name]  Procedure(s) Performed: Procedure(s): CARPAL TUNNEL RELEASE (Left)  Patient Location: PACU  Anesthesia Type:General  Level of Consciousness: sedated  Airway & Oxygen Therapy: Patient Spontanous Breathing and Patient connected to face mask oxygen  Post-op Assessment: Report given to RN and Post -op Vital signs reviewed and stable  Post vital signs: Reviewed and stable  Last Vitals:  Filed Vitals:   01/28/15 1117  BP: 144/85  Pulse: 97  Temp: 36.9 C  Resp: 97    Complications: No apparent anesthesia complications

## 2015-02-22 ENCOUNTER — Ambulatory Visit
Admission: RE | Admit: 2015-02-22 | Discharge: 2015-02-22 | Disposition: A | Discharge status: Home / Self Care | Payer: BLUE CROSS/BLUE SHIELD | Source: Ambulatory Visit | Attending: Pain Medicine | Admitting: Pain Medicine

## 2015-02-22 ENCOUNTER — Emergency Department: Payer: BLUE CROSS/BLUE SHIELD

## 2015-02-22 ENCOUNTER — Encounter: Payer: Self-pay | Admitting: Emergency Medicine

## 2015-02-22 ENCOUNTER — Emergency Department
Admission: EM | Admit: 2015-02-22 | Discharge: 2015-02-22 | Disposition: A | Discharge status: Home / Self Care | Payer: BLUE CROSS/BLUE SHIELD | Attending: Emergency Medicine | Admitting: Emergency Medicine

## 2015-02-22 DIAGNOSIS — I10 Essential (primary) hypertension: Secondary | ICD-10-CM | POA: Insufficient documentation

## 2015-02-22 DIAGNOSIS — R599 Enlarged lymph nodes, unspecified: Secondary | ICD-10-CM | POA: Diagnosis not present

## 2015-02-22 DIAGNOSIS — R000 Tachycardia, unspecified: Secondary | ICD-10-CM | POA: Insufficient documentation

## 2015-02-22 DIAGNOSIS — Z79899 Other long term (current) drug therapy: Secondary | ICD-10-CM | POA: Diagnosis not present

## 2015-02-22 DIAGNOSIS — R109 Unspecified abdominal pain: Secondary | ICD-10-CM | POA: Diagnosis present

## 2015-02-22 DIAGNOSIS — Z791 Long term (current) use of non-steroidal anti-inflammatories (NSAID): Secondary | ICD-10-CM | POA: Diagnosis not present

## 2015-02-22 DIAGNOSIS — N3090 Cystitis, unspecified without hematuria: Secondary | ICD-10-CM

## 2015-02-22 LAB — URINALYSIS COMPLETE WITH MICROSCOPIC (ARMC ONLY)
BILIRUBIN URINE: NEGATIVE
Glucose, UA: NEGATIVE mg/dL
Ketones, ur: NEGATIVE mg/dL
Nitrite: NEGATIVE
PH: 6 (ref 5.0–8.0)
Protein, ur: NEGATIVE mg/dL
SPECIFIC GRAVITY, URINE: 1.021 (ref 1.005–1.030)

## 2015-02-22 LAB — CBC WITH DIFFERENTIAL/PLATELET
BASOS PCT: 1 %
Basophils Absolute: 0.1 10*3/uL (ref 0–0.1)
EOS ABS: 0 10*3/uL (ref 0–0.7)
EOS PCT: 0 %
HCT: 43 % (ref 35.0–47.0)
Hemoglobin: 14.5 g/dL (ref 12.0–16.0)
Lymphocytes Relative: 16 %
Lymphs Abs: 1.7 10*3/uL (ref 1.0–3.6)
MCH: 28.2 pg (ref 26.0–34.0)
MCHC: 33.6 g/dL (ref 32.0–36.0)
MCV: 83.8 fL (ref 80.0–100.0)
MONO ABS: 0.6 10*3/uL (ref 0.2–0.9)
MONOS PCT: 6 %
NEUTROS PCT: 77 %
Neutro Abs: 8.4 10*3/uL — ABNORMAL HIGH (ref 1.4–6.5)
PLATELETS: 311 10*3/uL (ref 150–440)
RBC: 5.14 MIL/uL (ref 3.80–5.20)
RDW: 15.1 % — AB (ref 11.5–14.5)
WBC: 10.8 10*3/uL (ref 3.6–11.0)

## 2015-02-22 LAB — BASIC METABOLIC PANEL
Anion gap: 7 (ref 5–15)
BUN: 12 mg/dL (ref 6–20)
CALCIUM: 9.7 mg/dL (ref 8.9–10.3)
CO2: 28 mmol/L (ref 22–32)
CREATININE: 0.77 mg/dL (ref 0.44–1.00)
Chloride: 106 mmol/L (ref 101–111)
GFR calc non Af Amer: >60 mL/min (ref >60)
GLUCOSE: 109 mg/dL — AB (ref 65–99)
Potassium: 3.2 mmol/L — ABNORMAL LOW (ref 3.5–5.1)
Sodium: 141 mmol/L (ref 135–145)

## 2015-02-22 MED ORDER — KETOROLAC TROMETHAMINE 30 MG/ML IJ SOLN: 30.0000 mg | Freq: Once | INTRAMUSCULAR | Status: DC

## 2015-02-22 MED ORDER — MORPHINE SULFATE (PF) 4 MG/ML IV SOLN
INTRAVENOUS | Status: AC
  Filled 2015-02-22: qty 1

## 2015-02-22 MED ORDER — DEXTROSE 5 % IV SOLN
1.0000 g | Freq: Once | INTRAVENOUS | Status: AC
  Administered 2015-02-22: 1 g via INTRAVENOUS
  Filled 2015-02-22: qty 10

## 2015-02-22 MED ORDER — ONDANSETRON HCL 4 MG/2ML IJ SOLN
4.0000 mg | Freq: Once | INTRAMUSCULAR | Status: AC
  Administered 2015-02-22: 4 mg via INTRAVENOUS

## 2015-02-22 MED ORDER — OXYCODONE-ACETAMINOPHEN 5-325 MG PO TABS: 1.0000 | ORAL_TABLET | Freq: Four times a day (QID) | ORAL | Status: DC | PRN

## 2015-02-22 MED ORDER — SODIUM CHLORIDE 0.9 % IV BOLUS (SEPSIS)
1000.0000 mL | Freq: Once | INTRAVENOUS | Status: AC
  Administered 2015-02-22: 1000 mL via INTRAVENOUS

## 2015-02-22 MED ORDER — ONDANSETRON HCL 4 MG/2ML IJ SOLN
4.0000 mg | Freq: Once | INTRAMUSCULAR | Status: AC
  Administered 2015-02-22: 4 mg via INTRAVENOUS
  Filled 2015-02-22: qty 2

## 2015-02-22 MED ORDER — MORPHINE SULFATE (PF) 4 MG/ML IV SOLN
4.0000 mg | Freq: Once | INTRAVENOUS | Status: AC
  Administered 2015-02-22: 4 mg via INTRAVENOUS

## 2015-02-22 MED ORDER — MORPHINE SULFATE (PF) 4 MG/ML IV SOLN
4.0000 mg | Freq: Once | INTRAVENOUS | Status: AC
  Administered 2015-02-22: 4 mg via INTRAVENOUS
  Filled 2015-02-22: qty 1

## 2015-02-22 MED ORDER — ONDANSETRON HCL 4 MG/2ML IJ SOLN
INTRAMUSCULAR | Status: AC
  Filled 2015-02-22: qty 2

## 2015-02-22 MED ORDER — CEPHALEXIN 500 MG PO CAPS: 500.0000 mg | ORAL_CAPSULE | Freq: Three times a day (TID) | ORAL | Status: DC

## 2015-02-22 NOTE — Discharge Instructions (Signed)
Take keflex as prescribed for urinary tract infection.  Follow up with your doctor regarding the UTI and finding of an enlarged lymph node in your abdomen.   Pertinent findings of CT report: Vascular/Lymphatic: There is an enlarged lymph node in the left periaortic region on sequence 2, image 42. This structure measures 2.1 x 1.8 cm. Previously, lymph node in this area measured 1.2 x 1.3 cm. No other definite lymphadenopathy but limited evaluation on this noncontrast examination. In particular, the central mesentery is difficult to evaluate without IV and oral contrast. No evidence for an aortic aneurysm.  Reproductive: Uterus has been removed. Ovarian tissue is not confidently identified.  Other: No free fluid. No free air.  Musculoskeletal: No acute bone abnormality.  IMPRESSION: Enlarging retroperitoneal lymph node. This lymph node is located in the left periaortic station and has demonstrated interval growth. Findings are concerning for a neoplastic or lymphoproliferative process. Recommend better characterization of the lymphadenopathy with a contrast CT using IV and oral contrast.  There is no evidence for kidney stones or hydronephrosis but there is gas within the urinary bladder. This gas could be iatrogenic but cannot exclude a bladder infection.  Cannot exclude wall thickening in the distal stomach and duodenal bulb region.  You were prescribed a medication that is potentially sedating. Do not drink alcohol, drive or participate in any other potentially dangerous activities while taking this medication as it may make you sleepy. Do not take this medication with any other sedating medications, either prescription or over-the-counter. If you were prescribed Percocet or Vicodin, do not take these with acetaminophen (Tylenol) as it is already contained within these medications.   Opioid pain medications (or "narcotics") can be habit forming.  Use it as little as  possible to achieve adequate pain control.  Do not use or use it with extreme caution if you have a history of opiate abuse or dependence.  If you are on a pain contract with your primary care doctor or a pain specialist, be sure to let them know you were prescribed this medication today from the [facility] Emergency Department.  This medication is intended for your use only - do not give any to anyone else and keep it in a secure place where nobody else, especially children and pets, have access to it.  It will also cause or worsen constipation, so you may want to consider taking an over-the-counter stool softener while you are taking this medication.   Urinary Tract Infection Urinary tract infections (UTIs) can develop anywhere along your urinary tract. Your urinary tract is your body's drainage system for removing wastes and extra water. Your urinary tract includes two kidneys, two ureters, a bladder, and a urethra. Your kidneys are a pair of bean-shaped organs. Each kidney is about the size of your fist. They are located below your ribs, one on each side of your spine. CAUSES Infections are caused by microbes, which are microscopic organisms, including fungi, viruses, and bacteria. These organisms are so small that they can only be seen through a microscope. Bacteria are the microbes that most commonly cause UTIs. SYMPTOMS  Symptoms of UTIs may vary by age and gender of the patient and by the location of the infection. Symptoms in young women typically include a frequent and intense urge to urinate and a painful, burning feeling in the bladder or urethra during urination. Older women and men are more likely to be tired, shaky, and weak and have muscle aches and abdominal pain. A fever  may mean the infection is in your kidneys. Other symptoms of a kidney infection include pain in your back or sides below the ribs, nausea, and vomiting. DIAGNOSIS To diagnose a UTI, your caregiver will ask you  about your symptoms. Your caregiver will also ask you to provide a urine sample. The urine sample will be tested for bacteria and white blood cells. White blood cells are made by your body to help fight infection. TREATMENT  Typically, UTIs can be treated with medication. Because most UTIs are caused by a bacterial infection, they usually can be treated with the use of antibiotics. The choice of antibiotic and length of treatment depend on your symptoms and the type of bacteria causing your infection. HOME CARE INSTRUCTIONS  If you were prescribed antibiotics, take them exactly as your caregiver instructs you. Finish the medication even if you feel better after you have only taken some of the medication.  Drink enough water and fluids to keep your urine clear or pale yellow.  Avoid caffeine, tea, and carbonated beverages. They tend to irritate your bladder.  Empty your bladder often. Avoid holding urine for long periods of time.  Empty your bladder before and after sexual intercourse.  After a bowel movement, women should cleanse from front to back. Use each tissue only once. SEEK MEDICAL CARE IF:   You have back pain.  You develop a fever.  Your symptoms do not begin to resolve within 3 days. SEEK IMMEDIATE MEDICAL CARE IF:   You have severe back pain or lower abdominal pain.  You develop chills.  You have nausea or vomiting.  You have continued burning or discomfort with urination. MAKE SURE YOU:   Understand these instructions.  Will watch your condition.  Will get help right away if you are not doing well or get worse.   This information is not intended to replace advice given to you by your health care provider. Make sure you discuss any questions you have with your health care provider.   Document Released: 10/01/2004 Document Revised: 09/12/2014 Document Reviewed: 01/30/2011 Elsevier Interactive Patient Education [employer]

## 2015-02-22 NOTE — ED Provider Notes (Signed)
[facility] Emergency Department Provider Note  ____________________________________________  Time seen: 1:00 PM  I have reviewed the triage vital signs and the nursing notes.   HISTORY  Chief Complaint Flank Pain    HPI [name] is a 40 y.o. female who complains of right flank pain for the past day. She has a history of kidney stones and interstitial cystitis. Complains of dysuria and urgency. Right flank pain is sharp, severe, colicky and intermittent, radiating to the right lower quadrant. Status post appendectomy. No abnormal vaginal discharge or bleeding.     Past Medical History  Diagnosis Date  . Interstitial cystitis   . Endometriosis   . LBP (low back pain) 05/23/2013  . Ache in joint 02/04/2013  . Addiction, opium (HCC) 01/28/2012  . Hypertension     H/O-BEEN AT LEAST 5 YEARS  . Hypothyroidism   . Kidney stones     H/O CHRONIC KIDNEY STONE  . Complication of anesthesia   . PONV (postoperative nausea and vomiting)      Patient Active Problem List   Diagnosis Date Noted  . Chronic pain 11/27/2014  . Long term current use of opiate analgesic 11/27/2014  . Long term prescription opiate use 11/27/2014  . Opiate use 11/27/2014  . Encounter for therapeutic drug level monitoring 11/27/2014  . Encounter for chronic pain management 11/27/2014  . Chronic low back pain 11/27/2014  . Osteoarthrosis 11/27/2014  . 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 Methadone use (HCC) 11/27/2014  . History of methadone use (HCC) 11/27/2014  . Chronic abdominal pain 11/27/2014  . Pain due to interstitial cystitis 11/27/2014  . Chronic interstitial cystitis 11/27/2014  . Chronic female pelvic pain 11/27/2014  . Chronic right hip pain 11/27/2014  . Obesity, Class I, BMI 30-34.9 (68% higher incidence of chronic low back pain) 11/27/2014  . History of migraine 11/27/2014  . Encounter for long-term (current) use of other high-risk medications  11/27/2014  . 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 Cannot sleep 02/04/2013  . Affective disorder (HCC) 06/16/2012  . Episodic mood disorder (HCC) 06/16/2012  . Anxiety state 01/28/2012  . Opioid dependence (HCC) 01/28/2012  . Continuous opioid dependence (HCC) 03/31/2011  . Calculus of kidney 03/31/2011     Past Surgical History  Procedure Laterality Date  . Appendectomy    . Abdominal hysterectomy    . Cesarean section      X2  . Carpal tunnel    . Breast enhancement surgery    . Diagnostic laparoscopy    . Carpal tunnel release Left 01/28/2015    Procedure: CARPAL TUNNEL RELEASE;  Surgeon: Deeann Saint, MD;  Location: [facility] ORS;  Service: Orthopedics;  Laterality: Left;     Current Outpatient Rx  Name  Route  Sig  Dispense  Refill  . cephALEXin (KEFLEX) 500 MG capsule   Oral   Take 1 capsule (500 mg total) by mouth 3 (three) times daily.   21 capsule   0   . clonazePAM (KLONOPIN) 1 MG tablet   Oral   Take 1 mg by mouth 2 (two) times daily as needed.      2   . gabapentin (NEURONTIN) 400 MG capsule   Oral   Take 1 capsule (400 mg total) by mouth 3 (three) times daily.   60 capsule   3   . HYDROcodone-acetaminophen (NORCO) 5-325 MG tablet   Oral   Take 1-2 tablets by mouth every 6 (six) hours  as needed.   50 tablet   0   . meloxicam (MOBIC) 15 MG tablet   Oral   Take 1 tablet (15 mg total) by mouth daily.   30 tablet   3   . methadone (DOLOPHINE) 10 MG tablet   Oral   Take 1 tablet (10 mg total) by mouth every 6 (six) hours as needed for moderate pain or severe pain.   120 tablet   0     Do not place this medication, or any other prescri ...   . ondansetron (ZOFRAN ODT) 8 MG disintegrating tablet   Oral   Take 1 tablet (8 mg total) by mouth every 8 (eight) hours as needed for nausea.   20 tablet   0   . Oxycodone HCl 10 MG TABS   Oral   Take 1 tablet (10 mg total) by mouth every 6 (six)  hours as needed.   120 tablet   0     Do not place this medication, or any other prescri ...   . oxyCODONE-acetaminophen (ROXICET) 5-325 MG tablet   Oral   Take 1 tablet by mouth every 6 (six) hours as needed for severe pain.   12 tablet   0   . phenazopyridine (PYRIDIUM) 200 MG tablet   Oral   Take 1 tablet (200 mg total) by mouth 3 (three) times daily as needed for pain. Patient not taking: Reported on 11/27/2014   20 tablet   0      Allergies Phenergan; Stadol; Topamax; Toradol; Tramadol; and Sulfa antibiotics   No family history on file.  Social History Social History  Substance Use Topics  . Smoking status: Never Smoker   . Smokeless tobacco: None  . Alcohol Use: No    Review of Systems  Constitutional:   No fever or chills. No weight changes Eyes:   No blurry vision or double vision.  ENT:   No sore throat.  Cardiovascular:   No chest pain. Respiratory:   No dyspnea or cough. Gastrointestinal:   Right flank and right lower quadrant pain without vomiting and diarrhea.  No BRBPR or melena. Genitourinary:  Dysuria and urgency positive.. Musculoskeletal:   Negative for back pain. No joint swelling or pain. Skin:   Negative for rash. Neurological:   Negative for headaches, focal weakness or numbness. Psychiatric:  No anxiety or depression.   Endocrine:  No changes in energy or sleep difficulty.  10-point ROS otherwise negative.  ____________________________________________   PHYSICAL EXAM:  VITAL SIGNS: ED Triage Vitals  Enc Vitals Group     BP 02/22/15 0903 154/92 mmHg     Pulse Rate 02/22/15 0903 111     Resp 02/22/15 0903 18     Temp 02/22/15 0903 98.3 F (36.8 C)     Temp Source 02/22/15 0903 Oral     SpO2 02/22/15 0903 98 %     Weight 02/22/15 0903 120 lb (54.432 kg)     Height 02/22/15 0903  (1.499 m)     Head Cir --      Peak Flow --      Pain Score 02/22/15 0902 7     Pain Loc --      Pain Edu? --      Excl. in GC? --      Vital signs reviewed, nursing assessments reviewed.   Constitutional:   Alert and oriented. Well appearing and in no distress. Eyes:   No scleral icterus. No conjunctival pallor.  PERRL. EOMI ENT   Head:   Normocephalic and atraumatic.   Nose:   No congestion/rhinnorhea. No septal hematoma   Mouth/Throat:   MMM, no pharyngeal erythema. No peritonsillar mass.    Neck:   No stridor. No SubQ emphysema. No meningismus. Hematological/Lymphatic/Immunilogical:   No cervical lymphadenopathy. Cardiovascular:   Tachycardia heart rate 110. Symmetric bilateral radial and DP pulses.  No murmurs.  Respiratory:   Normal respiratory effort without tachypnea nor retractions. Breath sounds are clear and equal bilaterally. No wheezes/rales/rhonchi. Gastrointestinal:   Soft with right lower quadrant and suprapubic tenderness.. Non distended. There is no CVA tenderness.  No rebound, rigidity, or guarding. Genitourinary:   deferred Musculoskeletal:   Nontender with normal range of motion in all extremities. No joint effusions.  No lower extremity tenderness.  No edema. Neurologic:   Normal speech and language.  CN 2-10 normal. Motor grossly intact. No gross focal neurologic deficits are appreciated.  Skin:    Skin is warm, dry and intact. No rash noted.  No petechiae, purpura, or bullae. Psychiatric:   Mood and affect are normal. No SI or hallucinations ____________________________________________    LABS (pertinent positives/negatives) (all labs ordered are listed, but only abnormal results are displayed) Labs Reviewed  URINALYSIS COMPLETEWITH MICROSCOPIC ([facility] ONLY) - Abnormal; Notable for the following:    Color, Urine YELLOW (*)    APPearance HAZY (*)    Hgb urine dipstick 2+ (*)    Leukocytes, UA TRACE (*)    Bacteria, UA RARE (*)    Squamous Epithelial / LPF 6-30 (*)    All other components within normal limits  BASIC METABOLIC PANEL - Abnormal; Notable for the following:     Potassium 3.2 (*)    Glucose, Bld 109 (*)    All other components within normal limits  CBC WITH DIFFERENTIAL/PLATELET - Abnormal; Notable for the following:    RDW 15.1 (*)    Neutro Abs 8.4 (*)    All other components within normal limits  URINE CULTURE   ____________________________________________   EKG    ____________________________________________    RADIOLOGY  CT abdomen and pelvis reveals no stones or evidence of pyelonephritis, but there is an enlarged periaortic lymph node measuring about 2 cm. These results were discussed with the patient.  ____________________________________________   PROCEDURES   ____________________________________________   INITIAL IMPRESSION / ASSESSMENT AND PLAN / ED COURSE  Pertinent labs & imaging results that were available during my care of the patient were reviewed by me and considered in my medical decision making (see chart for details).  IV fluids, morphine and Zofran. We'll check labs. Urinalysis is consistent with urinary tract infection, so we'll get CT abdomen and pelvis noncontrast to evaluate for an associated obstructing stone.   ----------------------------------------- 3:17 PM on 02/22/2015 -----------------------------------------  Symptoms under control. Patient given IV ceftriaxone in the ED for UTI. Continue on Keflex, follow up with primary care for further evaluation.    ____________________________________________   FINAL CLINICAL IMPRESSION(S) / ED DIAGNOSES  Final diagnoses:  Cystitis  Enlarged lymph node      Sharman Cheek, MD 02/22/15 1517

## 2015-02-24 LAB — URINE CULTURE: Culture: 50000

## 2015-02-25 ENCOUNTER — Encounter: Payer: Self-pay | Admitting: Pain Medicine

## 2015-02-25 ENCOUNTER — Emergency Department
Admission: EM | Admit: 2015-02-25 | Discharge: 2015-02-25 | Disposition: A | Discharge status: Home / Self Care | Payer: BLUE CROSS/BLUE SHIELD | Attending: Emergency Medicine | Admitting: Emergency Medicine

## 2015-02-25 ENCOUNTER — Ambulatory Visit (HOSPITAL_BASED_OUTPATIENT_CLINIC_OR_DEPARTMENT_OTHER): Payer: BLUE CROSS/BLUE SHIELD | Admitting: Pain Medicine

## 2015-02-25 ENCOUNTER — Encounter: Payer: Self-pay | Admitting: Emergency Medicine

## 2015-02-25 ENCOUNTER — Emergency Department: Payer: BLUE CROSS/BLUE SHIELD

## 2015-02-25 VITALS — BP 138/100 | HR 78 | Temp 98.5°F | Resp 16 | Ht 59.0 in | Wt 115.0 lb

## 2015-02-25 DIAGNOSIS — Z5181 Encounter for therapeutic drug level monitoring: Secondary | ICD-10-CM | POA: Diagnosis not present

## 2015-02-25 DIAGNOSIS — G8929 Other chronic pain: Secondary | ICD-10-CM | POA: Diagnosis not present

## 2015-02-25 DIAGNOSIS — F1190 Opioid use, unspecified, uncomplicated: Secondary | ICD-10-CM

## 2015-02-25 DIAGNOSIS — R102 Pelvic and perineal pain: Secondary | ICD-10-CM | POA: Insufficient documentation

## 2015-02-25 DIAGNOSIS — Z7951 Long term (current) use of inhaled steroids: Secondary | ICD-10-CM | POA: Insufficient documentation

## 2015-02-25 DIAGNOSIS — Z791 Long term (current) use of non-steroidal anti-inflammatories (NSAID): Secondary | ICD-10-CM | POA: Diagnosis not present

## 2015-02-25 DIAGNOSIS — Z792 Long term (current) use of antibiotics: Secondary | ICD-10-CM | POA: Diagnosis not present

## 2015-02-25 DIAGNOSIS — F329 Major depressive disorder, single episode, unspecified: Secondary | ICD-10-CM

## 2015-02-25 DIAGNOSIS — I10 Essential (primary) hypertension: Secondary | ICD-10-CM

## 2015-02-25 DIAGNOSIS — E0590 Thyrotoxicosis, unspecified without thyrotoxic crisis or storm: Secondary | ICD-10-CM

## 2015-02-25 DIAGNOSIS — R350 Frequency of micturition: Secondary | ICD-10-CM | POA: Insufficient documentation

## 2015-02-25 DIAGNOSIS — M79642 Pain in left hand: Secondary | ICD-10-CM

## 2015-02-25 DIAGNOSIS — R1031 Right lower quadrant pain: Secondary | ICD-10-CM | POA: Insufficient documentation

## 2015-02-25 DIAGNOSIS — R590 Localized enlarged lymph nodes: Secondary | ICD-10-CM | POA: Diagnosis not present

## 2015-02-25 DIAGNOSIS — Z79891 Long term (current) use of opiate analgesic: Secondary | ICD-10-CM | POA: Diagnosis not present

## 2015-02-25 DIAGNOSIS — G43909 Migraine, unspecified, not intractable, without status migrainosus: Secondary | ICD-10-CM | POA: Insufficient documentation

## 2015-02-25 DIAGNOSIS — G5602 Carpal tunnel syndrome, left upper limb: Secondary | ICD-10-CM

## 2015-02-25 DIAGNOSIS — N949 Unspecified condition associated with female genital organs and menstrual cycle: Secondary | ICD-10-CM | POA: Diagnosis not present

## 2015-02-25 LAB — SEDIMENTATION RATE: Sed Rate: 7 mm/hr (ref 0–20)

## 2015-02-25 LAB — CBC WITH DIFFERENTIAL/PLATELET
Basophils Absolute: 0 10*3/uL (ref 0–0.1)
Basophils Relative: 0 %
EOS PCT: 0 %
Eosinophils Absolute: 0 10*3/uL (ref 0–0.7)
HEMATOCRIT: 40.8 % (ref 35.0–47.0)
Hemoglobin: 13.8 g/dL (ref 12.0–16.0)
LYMPHS ABS: 2 10*3/uL (ref 1.0–3.6)
LYMPHS PCT: 16 %
MCH: 28 pg (ref 26.0–34.0)
MCHC: 33.7 g/dL (ref 32.0–36.0)
MCV: 82.9 fL (ref 80.0–100.0)
MONO ABS: 0.9 10*3/uL (ref 0.2–0.9)
Monocytes Relative: 8 %
NEUTROS ABS: 9.1 10*3/uL — AB (ref 1.4–6.5)
Neutrophils Relative %: 76 %
PLATELETS: 319 10*3/uL (ref 150–440)
RBC: 4.93 MIL/uL (ref 3.80–5.20)
RDW: 15 % — AB (ref 11.5–14.5)
WBC: 12.2 10*3/uL — ABNORMAL HIGH (ref 3.6–11.0)

## 2015-02-25 LAB — URINALYSIS COMPLETE WITH MICROSCOPIC (ARMC ONLY)
BILIRUBIN URINE: NEGATIVE
Glucose, UA: NEGATIVE mg/dL
Leukocytes, UA: NEGATIVE
Nitrite: NEGATIVE
PH: 7 (ref 5.0–8.0)
PROTEIN: NEGATIVE mg/dL
Specific Gravity, Urine: 1.013 (ref 1.005–1.030)

## 2015-02-25 LAB — COMPREHENSIVE METABOLIC PANEL
ALBUMIN: 4.3 g/dL (ref 3.5–5.0)
ALT: 24 U/L (ref 14–54)
AST: 22 U/L (ref 15–41)
Alkaline Phosphatase: 79 U/L (ref 38–126)
Anion gap: 6 (ref 5–15)
BUN: 16 mg/dL (ref 6–20)
CHLORIDE: 106 mmol/L (ref 101–111)
CO2: 27 mmol/L (ref 22–32)
Calcium: 9.8 mg/dL (ref 8.9–10.3)
Creatinine, Ser: 0.84 mg/dL (ref 0.44–1.00)
GFR calc Af Amer: >60 mL/min (ref >60)
GFR calc non Af Amer: >60 mL/min (ref >60)
GLUCOSE: 94 mg/dL (ref 65–99)
POTASSIUM: 3.3 mmol/L — AB (ref 3.5–5.1)
Sodium: 139 mmol/L (ref 135–145)
Total Bilirubin: 0.8 mg/dL (ref 0.3–1.2)
Total Protein: 7.5 g/dL (ref 6.5–8.1)

## 2015-02-25 LAB — LIPASE, BLOOD: LIPASE: 25 U/L (ref 11–51)

## 2015-02-25 LAB — MAGNESIUM: Magnesium: 2.2 mg/dL (ref 1.7–2.4)

## 2015-02-25 MED ORDER — DIPHENHYDRAMINE HCL 50 MG/ML IJ SOLN
25.0000 mg | Freq: Once | INTRAMUSCULAR | Status: AC
  Administered 2015-02-25: 25 mg via INTRAVENOUS
  Filled 2015-02-25: qty 1

## 2015-02-25 MED ORDER — IOHEXOL 240 MG/ML SOLN
25.0000 mL | INTRAMUSCULAR | Status: AC
  Administered 2015-02-25: 25 mL via ORAL
  Filled 2015-02-25 (×2): qty 25

## 2015-02-25 MED ORDER — ONDANSETRON HCL 4 MG/2ML IJ SOLN
4.0000 mg | Freq: Once | INTRAMUSCULAR | Status: AC
  Administered 2015-02-25: 4 mg via INTRAVENOUS
  Filled 2015-02-25: qty 2

## 2015-02-25 MED ORDER — FENTANYL CITRATE (PF) 100 MCG/2ML IJ SOLN
50.0000 ug | Freq: Once | INTRAMUSCULAR | Status: AC
  Administered 2015-02-25: 50 ug via INTRAVENOUS

## 2015-02-25 MED ORDER — IOHEXOL 300 MG/ML  SOLN
75.0000 mL | Freq: Once | INTRAMUSCULAR | Status: AC | PRN
  Administered 2015-02-25: 75 mL via INTRAVENOUS
  Filled 2015-02-25: qty 75

## 2015-02-25 MED ORDER — SODIUM CHLORIDE 0.9 % IV BOLUS (SEPSIS)
1000.0000 mL | Freq: Once | INTRAVENOUS | Status: AC
  Administered 2015-02-25: 1000 mL via INTRAVENOUS

## 2015-02-25 MED ORDER — HYDROMORPHONE HCL 1 MG/ML IJ SOLN
0.5000 mg | Freq: Once | INTRAMUSCULAR | Status: AC
  Administered 2015-02-25: 0.5 mg via INTRAVENOUS
  Filled 2015-02-25: qty 1

## 2015-02-25 MED ORDER — METHADONE HCL 10 MG PO TABS: 10.0000 mg | ORAL_TABLET | Freq: Four times a day (QID) | ORAL | Status: DC | PRN

## 2015-02-25 MED ORDER — OXYCODONE HCL 10 MG PO TABS: 10.0000 mg | ORAL_TABLET | Freq: Four times a day (QID) | ORAL | Status: DC | PRN

## 2015-02-25 MED ORDER — FENTANYL CITRATE (PF) 100 MCG/2ML IJ SOLN
INTRAMUSCULAR | Status: AC
  Filled 2015-02-25: qty 2

## 2015-02-25 NOTE — ED Notes (Signed)
Pt presents with low back pain and right flank pain. Was here on Friday for same and dx with possible kidney stone that  May have passed. Pt was sent home on antibiotics.

## 2015-02-25 NOTE — Discharge Instructions (Signed)

## 2015-02-25 NOTE — Progress Notes (Signed)
Safety precautions to be maintained throughout the outpatient stay will include: orient to surroundings, keep bed in low position, maintain call bell within reach at all times, provide assistance with transfer out of bed and ambulation. Oxycodone pill count # 0/120  Filled 02-01-15 Methadone pill count # 13/120  Filled 01-29-15

## 2015-02-25 NOTE — Progress Notes (Signed)
Patient's Name: [name] MRN: [id number] DOB: 17-Nov-1975 DOS: 02/25/2015  Primary Reason(s) for Visit: Encounter for Medication Management CC: Back Pain and Abdominal Pain   HPI  [name] is a 40 y.o. year old, female patient, who returns today as an established patient. She has Major depressive disorder, recurrent severe without psychotic features (HCC); Chronic pain; Anxiety state; Continuous opioid dependence (HCC); Cannot sleep; Affective disorder (HCC); Calculus of kidney; Long term current use of opiate analgesic; Long term prescription opiate use; Opiate use (380 MME/Day); Encounter for therapeutic drug level monitoring; Encounter for chronic pain management; Chronic low back pain (Location of Primary Source of Pain) (Bilateral) (R>L); Osteoarthrosis; Episodic mood disorder (HCC); Opioid dependence (HCC); Methadone dependence (HCC); Methadone use (HCC); History of methadone use (HCC); Chronic abdominal pain; Pain due to interstitial cystitis; Chronic interstitial cystitis; Chronic pelvic pain (Location of Secondary source of pain) (suprapubic) (Bilateral) (R>L); Chronic hip pain (Right); Obesity, Class I, BMI 30-34.9 (68% higher incidence of chronic low back pain); History of migraine; Encounter for long-term (current) use of other high-risk medications; Chronic groin pain (Location of Tertiary source of pain) (Right); Chronic hand pain (Left) (CTS); and Carpal tunnel syndrome (Left) on her problem list.. Her primarily concern today is the Back Pain and Abdominal Pain   The patient returns to the clinics today for pharmacological management of her chronic pain. The patient recently had a CT scan of her abdomen and pelvis as well has one of her kidney stones done abdominal [facility] with findings of an enlarging retroperitoneal lymph node of unknown etiology. The lymph node is located in the left periarticular station area and has demonstrated interval growth. The  findings are concerning for a neoplastic or lymphoproliferative process. This is a solitary note measuring 2.3 cm and then has increased in size since December 2015. Currently this is being evaluated for the possibility of a neoplasm.  Her last EKG came back with a QTC of 436 ms (Normal: < 460 ms for females). The patient was informed of all the risk and possible complications associated with the use of methadone, especially the drug to drug interactions with possible prolongation of the QTc interval leading to  [place] arrhythmia.   Reported Pain Score: 7 , clinically she looks like a 3-4/10. Reported level is inconsistent with clinical obrservations. Pain Type: Chronic pain Pain Location: Back (abdomen) Pain Orientation: Lower Pain Descriptors / Indicators: Aching, Sharp Pain Frequency: Constant  Date of Last Visit: 11/27/14 Service Provided on Last Visit: Med Refill  Controlled Substance Pharmacotherapy Assessment  Analgesic: Methadone 10 mg by mouth every 6 hours plus oxycodone IR 10 mg by mouth every 6 hours MME/day: 380 mg/day Pharmacokinetics: Onset of action (Liberation/Absorption): Within expected pharmacological parameters. (One hour for methadone and 30-45 minutes for oxycodone) Time to Peak effect (Distribution): Timing and results are as within normal expected parameters. (2 hours for methadone and 1 hour for oxycodone) Duration of action (Metabolism/Excretion): Within normal limits for medication. (4 hours for methadone and 2 hours for oxycodone) Pharmacodynamics: Analgesic Effect: 80-85% Activity Facilitation: Medication(s) allow patient to sit, stand, walk, and do the basic ADLs Perceived Effectiveness: Described as relatively effective, allowing for increase in activities of daily living (ADL) Side-effects or Adverse reactions: None reported Monitoring: [place] PMP: Compliant with practice rules and regulations UDS Results/interpretation: The patient's last UDS  was done on 11/27/2014 and it came back with unexpected results. Although the methadone was found on the sample, there was no oxycodone  and there was also undeclared clonazepam. Medication Assessment Form: Reviewed. Patient indicates being compliant with therapy Treatment compliance: Deficiencies noted and steps taken to remind the patient of the seriousness of adequate therapy compliance Risk Assessment: Substance Use Disorder (SUD) Risk Level: Low Opioid Risk Tool (ORT) Score: Total Score: 0  Pharmacologic Plan: Continue therapy as is  Lab Work: Illicit Drugs Lab Results  Component Value Date   THCU NONE DETECTED 08/01/2014   PCPSCRNUR NONE DETECTED 08/01/2014   MDMA NONE DETECTED 08/01/2014   AMPHETMU NONE DETECTED 08/01/2014   METHADONE POSITIVE* 08/01/2014    Inflammation Markers Lab Results  Component Value Date   ESRSEDRATE 7 02/25/2015   CRP <0.5 02/25/2015    Renal Function Lab Results  Component Value Date   BUN 16 02/25/2015   CREATININE 0.84 02/25/2015   GFRAA >60 02/25/2015   GFRNONAA >60 02/25/2015    Hepatic Function Lab Results  Component Value Date   AST 22 02/25/2015   ALT 24 02/25/2015   ALBUMIN 4.3 02/25/2015    Electrolytes Lab Results  Component Value Date   NA 139 02/25/2015   K 3.3* 02/25/2015   CL 106 02/25/2015   CALCIUM 9.8 02/25/2015   MG 2.2 02/25/2015    Allergies  [name] is allergic to codeine; butorphanol tartrate; phenergan; promethazine; stadol; sumatriptan succinate; topamax; toradol; tramadol; and sulfa antibiotics.  Meds  The patient has a current medication list which includes the following prescription(s): albuterol, cephalexin, clonazepam, meloxicam, methadone, ondansetron, and oxycodone hcl.  Current Outpatient Prescriptions on File Prior to Visit  Medication Sig  . cephALEXin (KEFLEX) 500 MG capsule Take 1 capsule (500 mg total) by mouth 3 (three) times daily.  . clonazePAM (KLONOPIN) 1 MG tablet Take 1 mg by  mouth 2 (two) times daily as needed.  . meloxicam (MOBIC) 15 MG tablet Take 1 tablet (15 mg total) by mouth daily.  . ondansetron (ZOFRAN ODT) 8 MG disintegrating tablet Take 1 tablet (8 mg total) by mouth every 8 (eight) hours as needed for nausea.   No current facility-administered medications on file prior to visit.    ROS  Constitutional: Afebrile, no chills, well hydrated and well nourished Gastrointestinal: negative Musculoskeletal:negative Neurological: negative Behavioral/Psych: negative  PFSH  Medical:  [name]  has a past medical history of Interstitial cystitis; Endometriosis; LBP (low back pain) (05/23/2013); Ache in joint (02/04/2013); Addiction, opium (HCC) (01/28/2012); Hypertension; Hypothyroidism; Kidney stones; Complication of anesthesia; and PONV (postoperative nausea and vomiting). Family: family history is not on file. Surgical:  has past surgical history that includes Appendectomy; Abdominal hysterectomy; Cesarean section; carpal tunnel; Breast enhancement surgery; Diagnostic laparoscopy; and Carpal tunnel release (Left, 01/28/2015). Tobacco:  reports that she has never smoked. She does not have any smokeless tobacco history on file. Alcohol:  reports that she does not drink alcohol. Drug:  reports that she does not use illicit drugs.  Physical Exam  Vitals:  Today's Vitals   02/25/15 1322 02/25/15 1325  BP: 138/100   Pulse: 78   Temp: 98.5 F (36.9 C)   Resp: 16   Height: 4\' 11"  (1.499 m)   Weight: 115 lb (52.164 kg)   SpO2: 100%   PainSc: 7  7   PainLoc: Back     Calculated BMI: Body mass index is 23.21 kg/(m^2).  General appearance: alert, cooperative, appears stated age and no distress Eyes: PERLA Respiratory: No evidence respiratory distress, no audible rales or ronchi and no use of accessory muscles of respiration  Lumbar  Spine Inspection: No gross anomalies detected Alignment: Symetrical ROM: Decreased  Gait: WNL  Lower  Extremities Inspection: No gross anomalies detected ROM: Adequate Sensory:  Normal Motor: Unremarkable  Assessment & Plan  Primary Diagnosis & Pertinent Problem List: The primary encounter diagnosis was Chronic pain. Diagnoses of Encounter for therapeutic drug level monitoring, Long term current use of opiate analgesic, Chronic female pelvic pain, Chronic groin pain, right, Chronic hand pain (Left) (CTS), Carpal tunnel syndrome (Left), and Opiate use (380 MME/Day) were also pertinent to this visit.  Visit Diagnosis: 1. Chronic pain   2. Encounter for therapeutic drug level monitoring   3. Long term current use of opiate analgesic   4. Chronic female pelvic pain   5. Chronic groin pain, right   6. Chronic hand pain (Left) (CTS)   7. Carpal tunnel syndrome (Left)   8. Opiate use (380 MME/Day)     Problem-specific Plan(s): No problem-specific assessment & plan notes found for this encounter.   Plan of Care  Pharmacotherapy (Medications Ordered): Meds ordered this encounter  Medications  . Oxycodone HCl 10 MG TABS    Sig: Take 1 tablet (10 mg total) by mouth every 6 (six) hours as needed.    Dispense:  120 tablet    Refill:  0    Do not place this medication, or any other prescription from our practice, on "Automatic Refill". Patient may have prescription filled one day early if pharmacy is closed on scheduled refill date. Do not fill until: 03/01/15 To last until: 03/31/15  . methadone (DOLOPHINE) 10 MG tablet    Sig: Take 1 tablet (10 mg total) by mouth every 6 (six) hours as needed for moderate pain or severe pain.    Dispense:  120 tablet    Refill:  0    Do not place this medication, or any other prescription from our practice, on "Automatic Refill". Patient may have prescription filled one day early if pharmacy is closed on scheduled refill date. Do not fill until: 03/01/15 To last until: 03/31/15    [facility] & Procedure Ordered: Orders Placed This Encounter   Procedures  . C-reactive protein  . Magnesium  . Sedimentation rate  . Vitamin B12    Indication: Bone Pain (M89.9)  . Vitamin D pnl(25-hydrxy+1,25-dihy)-bld  . ToxASSURE Select 13 (MW), Urine    Volume: 30 ml(s). Minimum 3 ml of urine is needed. Document temperature of fresh sample. Indications: Long term (current) use of opiate analgesic (W09.811)    Imaging Ordered: None  Interventional Therapies: Scheduled: None at this time until she has her lymphadenopathy evaluated and they have ruled out infection versus a neoplasm. PRN Procedures: For the low back pain we will consider the possibility of a diagnostic bilateral lumbar facet block under fluoroscopic guidance and IV sedation. On the patient's next visit will examine her further to determine if she has a significant sacroiliac joint component to this pain.    Referral(s) or Consult(s): None at this time, but the patient was instructed to contact her primary care physician for further workup of this lymph node  Medications administered during this visit: Ms. Shane had no medications administered during this visit.  Future Appointments Date Time Provider Department Center  03/25/2015 8:40 AM Delano Metz, MD [facility] None    Primary Care Physician: Domenic Schwab, FNP Location: [facility] Outpatient Pain Management Facility Note by: Sydnee Levans. Laban Emperor, M.D, DABA, DABAPM, DABPM, DABIPP, FIPP

## 2015-02-25 NOTE — ED Notes (Signed)
Lower back and right flank pain .Marland Kitchenwas seen and dx'd with UTI and possible renal stones. States pain became worse today

## 2015-02-25 NOTE — ED Notes (Signed)
AAOx3.  Skin warm and dry.  NAD 

## 2015-02-25 NOTE — ED Notes (Signed)
Patient discharged to home per MD order. Patient in stable condition, and deemed medically cleared by ED provider for discharge. Discharge instructions reviewed with patient/family using "Teach Back"; verbalized understanding of medication education and administration, and information about follow-up care. Denies further concerns. ° °

## 2015-02-26 LAB — C-REACTIVE PROTEIN: CRP: <0.5 mg/dL (ref <1.0)

## 2015-02-26 NOTE — ED Provider Notes (Signed)
[facility] Emergency Department Provider Note ____________________________________________  Time seen: Approximately 05:09 PM  I have reviewed the triage vital signs and the nursing notes.   HISTORY  Chief Complaint Flank Pain   HPI [name] is a 40 y.o. female who presents to the emergency department for evaluation of right flank and right lower quadrant pain. She has had chronic pain in this area, which seems to be increasing. She was evaluated here on the 17th for the same and had a CT with stone protocol. She states that she has an enlarged lymph node and is scheduled with her PCP later this week. Pain has increased since her last visit. Symptoms have otherwise remained the same.   Past Medical History  Diagnosis Date  . Interstitial cystitis   . Endometriosis   . LBP (low back pain) 05/23/2013  . Ache in joint 02/04/2013  . Addiction, opium (HCC) 01/28/2012  . Hypertension     H/O-BEEN AT LEAST 5 YEARS  . Hypothyroidism   . Kidney stones     H/O CHRONIC KIDNEY STONE  . Complication of anesthesia   . PONV (postoperative nausea and vomiting)     Patient Active Problem List   Diagnosis Date Noted  . Chronic pain 11/27/2014  . Long term current use of opiate analgesic 11/27/2014  . Long term prescription opiate use 11/27/2014  . Opiate use 11/27/2014  . Encounter for therapeutic drug level monitoring 11/27/2014  . Encounter for chronic pain management 11/27/2014  . Chronic low back pain 11/27/2014  . Osteoarthrosis 11/27/2014  . 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 Methadone use (HCC) 11/27/2014  . History of methadone use (HCC) 11/27/2014  . Chronic abdominal pain 11/27/2014  . Pain due to interstitial cystitis 11/27/2014  . Chronic interstitial cystitis 11/27/2014  . Chronic female pelvic pain 11/27/2014  . Chronic right hip pain 11/27/2014  . Obesity, Class I, BMI 30-34.9 (68% higher incidence of chronic low back pain)  11/27/2014  . History of migraine 11/27/2014  . Encounter for long-term (current) use of other high-risk medications 11/27/2014  . 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 Cannot sleep 02/04/2013  . Affective disorder (HCC) 06/16/2012  . Episodic mood disorder (HCC) 06/16/2012  . Anxiety state 01/28/2012  . Opioid dependence (HCC) 01/28/2012  . Continuous opioid dependence (HCC) 03/31/2011  . Calculus of kidney 03/31/2011    Past Surgical History  Procedure Laterality Date  . Appendectomy    . Abdominal hysterectomy    . Cesarean section      X2  . Carpal tunnel    . Breast enhancement surgery    . Diagnostic laparoscopy    . Carpal tunnel release Left 01/28/2015    Procedure: CARPAL TUNNEL RELEASE;  Surgeon: Deeann Saint, MD;  Location: [facility] ORS;  Service: Orthopedics;  Laterality: Left;    Current Outpatient Rx  Name  Route  Sig  Dispense  Refill  . albuterol (PROAIR HFA) 108 (90 Base) MCG/ACT inhaler   Inhalation   Inhale into the lungs.         . cephALEXin (KEFLEX) 500 MG capsule   Oral   Take 1 capsule (500 mg total) by mouth 3 (three) times daily.   21 capsule   0   . clonazePAM (KLONOPIN) 1 MG tablet   Oral   Take 1 mg by mouth 2 (two) times daily as needed.      2   . meloxicam (MOBIC) 15  MG tablet   Oral   Take 1 tablet (15 mg total) by mouth daily.   30 tablet   3   . methadone (DOLOPHINE) 10 MG tablet   Oral   Take 1 tablet (10 mg total) by mouth every 6 (six) hours as needed for moderate pain or severe pain.   120 tablet   0     Do not place this medication, or any other prescri ...   . ondansetron (ZOFRAN ODT) 8 MG disintegrating tablet   Oral   Take 1 tablet (8 mg total) by mouth every 8 (eight) hours as needed for nausea.   20 tablet   0   . Oxycodone HCl 10 MG TABS   Oral   Take 1 tablet (10 mg total) by mouth every 6 (six) hours as needed.   120 tablet   0     Do not place this  medication, or any other prescri ...     Allergies Codeine; Butorphanol tartrate; Phenergan; Promethazine; Stadol; Sumatriptan succinate; Topamax; Toradol; Tramadol; and Sulfa antibiotics  No family history on file.  Social History Social History  Substance Use Topics  . Smoking status: Never Smoker   . Smokeless tobacco: None  . Alcohol Use: No    Review of Systems Constitutional: No fever/chills Eyes: No visual changes. ENT: No sore throat. Cardiovascular: Denies chest pain. Respiratory: Denies shortness of breath. Gastrointestinal: Positive for abdominal pain.  No nausea, no vomiting.  No diarrhea.  No constipation. Genitourinary: Negative for dysuria. Positive for frequency. Musculoskeletal: Positive for right flank pain. Skin: Negative for rash. Neurological: Negative for headaches, focal weakness or numbness.   ____________________________________________   PHYSICAL EXAM:  VITAL SIGNS: ED Triage Vitals  Enc Vitals Group     BP 02/25/15 1648 132/95 mmHg     Pulse Rate 02/25/15 1648 113     Resp 02/25/15 1648 20     Temp 02/25/15 1648 98.6 F (37 C)     Temp Source 02/25/15 1648 Oral     SpO2 02/25/15 1648 99 %     Weight --      Height 02/25/15 1648  (1.499 m)     Head Cir --      Peak Flow --      Pain Score 02/25/15 1648 7     Pain Loc --      Pain Edu? --      Excl. in GC? --     Constitutional: Alert and oriented. Well appearing and in no acute distress. Eyes: Conjunctivae are normal. PERRL. EOMI. Head: Atraumatic. Mouth/Throat: Mucous membranes are moist. Hematological/Lymphatic/Immunilogical: No cervical lymphadenopathy. Cardiovascular: Normal rate, regular rhythm. Grossly normal heart sounds.  Good peripheral circulation. Respiratory: Normal respiratory effort.  No retractions. Lungs CTAB. Gastrointestinal: Soft, tenderness to light touch of the right lower quadrant. No distention. No abdominal bruits. Musculoskeletal: No lower  extremity tenderness nor edema.  Neurologic:  Normal speech and language. No gross focal neurologic deficits are appreciated. No gait instability. Skin:  Skin is warm, dry and intact. No rash noted. Psychiatric: Mood and affect are normal. Speech and behavior are normal.  ____________________________________________   LABS (all labs ordered are listed, but only abnormal results are displayed)  Labs Reviewed  URINALYSIS COMPLETEWITH MICROSCOPIC ([facility] ONLY) - Abnormal; Notable for the following:    Color, Urine YELLOW (*)    APPearance CLEAR (*)    Ketones, ur TRACE (*)    Hgb urine dipstick 2+ (*)  Bacteria, UA RARE (*)    Squamous Epithelial / LPF 6-30 (*)    All other components within normal limits  COMPREHENSIVE METABOLIC PANEL - Abnormal; Notable for the following:    Potassium 3.3 (*)    All other components within normal limits  CBC WITH DIFFERENTIAL/PLATELET - Abnormal; Notable for the following:    WBC 12.2 (*)    RDW 15.0 (*)    Neutro Abs 9.1 (*)    All other components within normal limits  LIPASE, BLOOD   ____________________________________________  EKG   ____________________________________________  RADIOLOGY  CT with contrast showing a solitary enlarged left periaortic lymph node measuring 2.3cm which has increased in size since December, 2015 and may be reactive or neoplastic per radiology. ____________________________________________   PROCEDURES  Procedure(s) performed: None  Critical Care performed: No  ____________________________________________   INITIAL IMPRESSION / ASSESSMENT AND PLAN / ED COURSE  Pertinent labs & imaging results that were available during my care of the patient were reviewed by me and considered in my medical decision making (see chart for details).  Patient will follow up with her PCP. She was also given oncology information to schedule a follow up. She was advised to finish the antibiotic she is taking for the UTI.  She will not receive controlled substance Rx today as she is a pain management patient and takes methadone daily. She will discuss pain control with pain management.  ____________________________________________   FINAL CLINICAL IMPRESSION(S) / ED DIAGNOSES  Final diagnoses:  Periaortic lymphadenopathy  Right lower quadrant pain      Chinita Pester, FNP 02/26/15 1337  Arnaldo Natal, MD 03/07/15 2333

## 2015-03-01 DIAGNOSIS — G8929 Other chronic pain: Secondary | ICD-10-CM | POA: Insufficient documentation

## 2015-03-01 DIAGNOSIS — R1030 Lower abdominal pain, unspecified: Secondary | ICD-10-CM

## 2015-03-01 DIAGNOSIS — M79642 Pain in left hand: Secondary | ICD-10-CM

## 2015-03-01 DIAGNOSIS — G5602 Carpal tunnel syndrome, left upper limb: Secondary | ICD-10-CM | POA: Insufficient documentation

## 2015-03-03 LAB — TOXASSURE SELECT 13 (MW), URINE: PDF: 0

## 2015-03-25 ENCOUNTER — Ambulatory Visit: Payer: BLUE CROSS/BLUE SHIELD | Attending: Pain Medicine | Admitting: Pain Medicine

## 2015-03-25 ENCOUNTER — Encounter: Payer: Self-pay | Admitting: Pain Medicine

## 2015-03-25 VITALS — BP 110/77 | HR 104 | Temp 97.9°F | Resp 16 | Ht 59.0 in | Wt 115.0 lb

## 2015-03-25 DIAGNOSIS — R1031 Right lower quadrant pain: Secondary | ICD-10-CM | POA: Diagnosis not present

## 2015-03-25 DIAGNOSIS — M549 Dorsalgia, unspecified: Secondary | ICD-10-CM | POA: Diagnosis present

## 2015-03-25 DIAGNOSIS — R102 Pelvic and perineal pain: Secondary | ICD-10-CM | POA: Diagnosis not present

## 2015-03-25 DIAGNOSIS — Z87898 Personal history of other specified conditions: Secondary | ICD-10-CM

## 2015-03-25 DIAGNOSIS — Z5181 Encounter for therapeutic drug level monitoring: Secondary | ICD-10-CM | POA: Diagnosis not present

## 2015-03-25 DIAGNOSIS — F339 Major depressive disorder, recurrent, unspecified: Secondary | ICD-10-CM | POA: Diagnosis not present

## 2015-03-25 DIAGNOSIS — Z6830 Body mass index (BMI) 30.0-30.9, adult: Secondary | ICD-10-CM | POA: Insufficient documentation

## 2015-03-25 DIAGNOSIS — G8929 Other chronic pain: Secondary | ICD-10-CM | POA: Diagnosis not present

## 2015-03-25 DIAGNOSIS — F39 Unspecified mood [affective] disorder: Secondary | ICD-10-CM | POA: Diagnosis not present

## 2015-03-25 DIAGNOSIS — Z90710 Acquired absence of both cervix and uterus: Secondary | ICD-10-CM | POA: Insufficient documentation

## 2015-03-25 DIAGNOSIS — F1190 Opioid use, unspecified, uncomplicated: Secondary | ICD-10-CM

## 2015-03-25 DIAGNOSIS — F1121 Opioid dependence, in remission: Secondary | ICD-10-CM | POA: Diagnosis not present

## 2015-03-25 DIAGNOSIS — Z9889 Other specified postprocedural states: Secondary | ICD-10-CM | POA: Insufficient documentation

## 2015-03-25 DIAGNOSIS — G5602 Carpal tunnel syndrome, left upper limb: Secondary | ICD-10-CM | POA: Diagnosis not present

## 2015-03-25 DIAGNOSIS — N200 Calculus of kidney: Secondary | ICD-10-CM | POA: Diagnosis not present

## 2015-03-25 DIAGNOSIS — E669 Obesity, unspecified: Secondary | ICD-10-CM | POA: Diagnosis not present

## 2015-03-25 DIAGNOSIS — R1011 Right upper quadrant pain: Secondary | ICD-10-CM

## 2015-03-25 DIAGNOSIS — Z79891 Long term (current) use of opiate analgesic: Secondary | ICD-10-CM

## 2015-03-25 DIAGNOSIS — M109 Gout, unspecified: Secondary | ICD-10-CM | POA: Insufficient documentation

## 2015-03-25 DIAGNOSIS — M79642 Pain in left hand: Secondary | ICD-10-CM | POA: Insufficient documentation

## 2015-03-25 DIAGNOSIS — M25551 Pain in right hip: Secondary | ICD-10-CM | POA: Insufficient documentation

## 2015-03-25 DIAGNOSIS — F1191 Opioid use, unspecified, in remission: Secondary | ICD-10-CM

## 2015-03-25 DIAGNOSIS — E039 Hypothyroidism, unspecified: Secondary | ICD-10-CM | POA: Diagnosis not present

## 2015-03-25 DIAGNOSIS — F419 Anxiety disorder, unspecified: Secondary | ICD-10-CM | POA: Diagnosis not present

## 2015-03-25 DIAGNOSIS — M545 Low back pain: Secondary | ICD-10-CM | POA: Insufficient documentation

## 2015-03-25 DIAGNOSIS — R109 Unspecified abdominal pain: Secondary | ICD-10-CM | POA: Insufficient documentation

## 2015-03-25 MED ORDER — NALOXONE HCL 4 MG/0.1ML NA LIQD: 1.0000 | Freq: Once | NASAL | Status: DC

## 2015-03-25 MED ORDER — OXYCODONE HCL 10 MG PO TABS: 10.0000 mg | ORAL_TABLET | Freq: Four times a day (QID) | ORAL | Status: DC | PRN

## 2015-03-25 MED ORDER — METHADONE HCL 10 MG PO TABS: 10.0000 mg | ORAL_TABLET | Freq: Four times a day (QID) | ORAL | Status: DC

## 2015-03-25 MED ORDER — METHADONE HCL 10 MG PO TABS: 10.0000 mg | ORAL_TABLET | Freq: Four times a day (QID) | ORAL | Status: DC | PRN

## 2015-03-25 NOTE — Progress Notes (Signed)
Patient's Name: Susan BarriosMichelle F Mohammed MRN: [id number] DOB: [birth year] DOS: 03/25/2015  Primary Reason(s) for Visit: Encounter for Medication Management CC: Back Pain and Abdominal Pain   HPI  Ms. [name] is a 40 y.o. year old, female patient, who returns today as an established patient. She has Major depressive disorder, recurrent severe without psychotic features (HCC); Chronic pain; Anxiety state; Continuous opioid dependence (HCC); Cannot sleep; Affective disorder (HCC); Calculus of kidney; Long term current use of opiate analgesic; Long term prescription opiate use; Opiate use (380 MME/Day); Encounter for therapeutic drug level monitoring; Encounter for chronic pain management; Chronic low back pain (Location of Primary Source of Pain) (Bilateral) (R>L); Osteoarthrosis; Episodic mood disorder (HCC); Opioid dependence (HCC); Methadone dependence (HCC); Methadone use (HCC); History of methadone use (HCC); Pain due to interstitial cystitis; Chronic interstitial cystitis; Chronic pelvic pain (Location of Secondary source of pain) (suprapubic) (Bilateral) (R>L); Chronic hip pain (Right); Obesity, Class I, BMI 30-34.9 (68% higher incidence of chronic low back pain); History of migraine; Encounter for long-term (current) use of other high-risk medications; Chronic groin pain (Location of Tertiary source of pain) (Right); Chronic hand pain (Left) (CTS); Carpal tunnel syndrome (Left); Chronic abdominal pain (Location of Primary Source of Pain) (RLQ: right lower quadrant) (Right); and Chronic flank pain (Location of Secondary source of pain) (Right) on her problem list.. Her primarily concern today is the Back Pain and Abdominal Pain   The patient comes in today clinics today for pharmacological management of her chronic pain.Today the patient was informed of the impending changes to opioid rules and regulations and how this will be affecting her in the sense that we will need to calm down on her dosing and perhaps  do a drug holiday and readjustment of her medicines. She has a 380 MME/Day, which is well above levels comes to her to be safe by the CDC.  Patient indicates having ran out of medicine 5 days ago. Her refill is 6 days from today.  Pain Assessment: Self-Reported Pain Score: 6  Reported level is compatible with observation Pain Type: Chronic pain Pain Location: Back (abdomen) Pain Orientation: Lower, Right Pain Descriptors / Indicators: Aching, Sharp Pain Frequency: Constant       Controlled Substance Pharmacotherapy Assessment  Analgesic: Methadone 10 mg every 6 hours (40 mg/day) plus oxycodone IR 10 mg every 6 hours (40 mg/day) MME/day: 380 mg/day Pharmacokinetics: Onset of action (Liberation/Absorption): Within expected pharmacological parameters Time to Peak effect (Distribution): Timing and results are as within normal expected parameters Duration of action (Metabolism/Excretion): Within normal limits for medication Pharmacodynamics: Analgesic Effect: More than 50% Activity Facilitation: Medication(s) allow patient to sit, stand, walk, and do the basic ADLs Perceived Effectiveness: Described as relatively effective, allowing for increase in activities of daily living (ADL) Side-effects or Adverse reactions: None reported Monitoring: [place] PMP: Compliant with practice rules and regulations UDS Results/interpretation: The patient's last UDS was done on 02/25/2015 and it was read as abnormal since it did not have any levels of oxycodone. However, he did have levels of normal oxycodone suggesting that the patient had metabolized the parenting compound. The same was true for the methadone. Medication Assessment Form: Reviewed. Patient indicates being compliant with therapy Treatment compliance: Non-compliant. The patient indicates having ran out of medicine 5 days ago and her next refill is 6 days from today. Risk Assessment: Aberrant Behavior: None observed today Substance Use Disorder  (SUD) Risk Level: Low Opioid Risk Tool (ORT) Score:  0 Low Risk for SUD (Score <3) Depression Scale  Score: PHQ-2: PHQ-2 Total Score: 0 No depression (0) PHQ-9: PHQ-9 Total Score: 0 No depression (0-4)  Pharmacologic Plan: No change in therapy, at this time   Laboratory Workup  Last ED UDS: Lab Results  Component Value Date   THCU NONE DETECTED 08/01/2014   PCPSCRNUR NONE DETECTED 08/01/2014   MDMA NONE DETECTED 08/01/2014   AMPHETMU NONE DETECTED 08/01/2014   METHADONE POSITIVE* 08/01/2014    Inflammation Markers Lab Results  Component Value Date   ESRSEDRATE 7 02/25/2015   CRP <0.5 02/25/2015    Renal Function Lab Results  Component Value Date   BUN 16 02/25/2015   CREATININE 0.84 02/25/2015   GFRAA >60 02/25/2015   GFRNONAA >60 02/25/2015    Hepatic Function Lab Results  Component Value Date   AST 22 02/25/2015   ALT 24 02/25/2015   ALBUMIN 4.3 02/25/2015    Electrolytes Lab Results  Component Value Date   NA 139 02/25/2015   K 3.3* 02/25/2015   CL 106 02/25/2015   CALCIUM 9.8 02/25/2015   MG 2.2 02/25/2015    Allergies  Ms. Raine is allergic to codeine; butorphanol tartrate; phenergan; promethazine; stadol; sumatriptan succinate; topamax; toradol; tramadol; and sulfa antibiotics.  Meds  The patient has a current medication list which includes the following prescription(s): albuterol, clonazepam, meloxicam, methadone, ondansetron, oxycodone hcl, quetiapine, methadone, methadone, naloxone hcl, oxycodone hcl, and oxycodone hcl.  Current Outpatient Prescriptions on File Prior to Visit  Medication Sig  . albuterol (PROAIR HFA) 108 (90 Base) MCG/ACT inhaler Inhale into the lungs.  . clonazePAM (KLONOPIN) 1 MG tablet Take 1 mg by mouth 2 (two) times daily as needed.  . meloxicam (MOBIC) 15 MG tablet Take 1 tablet (15 mg total) by mouth daily.  . ondansetron (ZOFRAN ODT) 8 MG disintegrating tablet Take 1 tablet (8 mg total) by mouth every 8 (eight) hours  as needed for nausea.   No current facility-administered medications on file prior to visit.    ROS  Constitutional: Afebrile, no chills, well hydrated and well nourished Gastrointestinal: negative Musculoskeletal:negative Neurological: negative Behavioral/Psych: negative  PFSH  Medical:  Ms. Umland  has a past medical history of Interstitial cystitis; Endometriosis; LBP (low back pain) (05/23/2013); Ache in joint (02/04/2013); Addiction, opium (HCC) (01/28/2012); Hypertension; Hypothyroidism; Kidney stones; Complication of anesthesia; and PONV (postoperative nausea and vomiting). Family: family history is not on file. Surgical:  has past surgical history that includes Appendectomy; Abdominal hysterectomy; Cesarean section; carpal tunnel; Breast enhancement surgery; Diagnostic laparoscopy; and Carpal tunnel release (Left, 01/28/2015). Tobacco:  reports that she has never smoked. She does not have any smokeless tobacco history on file. Alcohol:  reports that she does not drink alcohol. Drug:  reports that she does not use illicit drugs.  Physical Exam  Vitals:  Today's Vitals   03/25/15 0900 03/25/15 0901  BP: 110/77   Pulse: 104   Temp: 97.9 F (36.6 C)   Resp: 16   Height:  (1.499 m)   Weight: 115 lb (52.164 kg)   SpO2: 100%   PainSc: 6  6   PainLoc: Back     Calculated BMI: Body mass index is 23.21 kg/(m^2).     General appearance: alert, cooperative, appears stated age and no distress Eyes: PERLA Respiratory: No evidence respiratory distress, no audible rales or ronchi and no use of accessory muscles of respiration  Cervical Spine Inspection: Normal anatomy Alignment: Symetrical ROM: Adequate  Upper Extremities Inspection: No gross anomalies detected ROM: Adequate Sensory: Normal Motor: Unremarkable  Thoracic Spine Inspection: No gross anomalies detected Alignment: Symetrical ROM: Adequate  Lumbar Spine Inspection: No gross anomalies  detected Alignment: Symetrical ROM: Adequate  Gait: WNL  Lower Extremities Inspection: No gross anomalies detected ROM: Adequate Sensory:  Normal Motor: Unremarkable  Assessment & Plan  Primary Diagnosis & Pertinent Problem List: The primary encounter diagnosis was Chronic pain. Diagnoses of Encounter for therapeutic drug level monitoring, History of methadone use (HCC), Long term current use of opiate analgesic, Chronic low back pain (Location of Primary Source of Pain) (Bilateral) (R>L), Opiate use (380 MME/Day), Chronic abdominal pain (Location of Primary Source of Pain) (RLQ: right lower quadrant) (Right), and Chronic flank pain (Location of Secondary source of pain) (Right) were also pertinent to this visit.  Visit Diagnosis: 1. Chronic pain   2. Encounter for therapeutic drug level monitoring   3. History of methadone use (HCC)   4. Long term current use of opiate analgesic   5. Chronic low back pain (Location of Primary Source of Pain) (Bilateral) (R>L)   6. Opiate use (380 MME/Day)   7. Chronic abdominal pain (Location of Primary Source of Pain) (RLQ: right lower quadrant) (Right)   8. Chronic flank pain (Location of Secondary source of pain) (Right)     Problem-specific Plan(s): No problem-specific assessment & plan notes found for this encounter.   Plan of Care  Pharmacotherapy (Medications Ordered): Meds ordered this encounter  Medications  . Naloxone HCl (NARCAN) 4 MG/0.1ML LIQD    Sig: Place 1 spray into the nose once. Spray half of bottle content into each nostril, then call 911    Dispense:  2 each    Refill:  0    Please instruct patient in the proper use of the medication.  . methadone (DOLOPHINE) 10 MG tablet    Sig: Take 1 tablet (10 mg total) by mouth every 6 (six) hours as needed for moderate pain or severe pain.    Dispense:  120 tablet    Refill:  0    Do not place this medication, or any other prescription from our practice, on "Automatic Refill".  Patient may have prescription filled one day early if pharmacy is closed on scheduled refill date. Do not fill until: 03/31/15 To last until: 04/30/15  . Oxycodone HCl 10 MG TABS    Sig: Take 1 tablet (10 mg total) by mouth every 6 (six) hours as needed.    Dispense:  120 tablet    Refill:  0    Do not place this medication, or any other prescription from our practice, on "Automatic Refill". Patient may have prescription filled one day early if pharmacy is closed on scheduled refill date. Do not fill until: 03/31/15 To last until: 04/30/15  . methadone (DOLOPHINE) 10 MG tablet    Sig: Take 1 tablet (10 mg total) by mouth every 6 (six) hours.    Dispense:  120 tablet    Refill:  0    Do not place this medication, or any other prescription from our practice, on "Automatic Refill". Patient may have prescription filled one day early if pharmacy is closed on scheduled refill date. Do not fill until: 04/30/15 To last until: 05/30/15  . methadone (DOLOPHINE) 10 MG tablet    Sig: Take 1 tablet (10 mg total) by mouth every 6 (six) hours.    Dispense:  120 tablet    Refill:  0    Do not place this medication, or any other prescription from our practice, on "Automatic  Refill". Patient may have prescription filled one day early if pharmacy is closed on scheduled refill date. Do not fill until: 05/30/15 To last until: 06/29/15  . Oxycodone HCl 10 MG TABS    Sig: Take 1 tablet (10 mg total) by mouth every 6 (six) hours as needed.    Dispense:  120 tablet    Refill:  0    Do not place this medication, or any other prescription from our practice, on "Automatic Refill". Patient may have prescription filled one day early if pharmacy is closed on scheduled refill date. Do not fill until: 04/30/15 To last until: 05/30/15  . Oxycodone HCl 10 MG TABS    Sig: Take 1 tablet (10 mg total) by mouth every 6 (six) hours as needed.    Dispense:  120 tablet    Refill:  0    Do not place this medication, or any  other prescription from our practice, on "Automatic Refill". Patient may have prescription filled one day early if pharmacy is closed on scheduled refill date. Do not fill until: 05/30/15 To last until: 06/29/15    [facility] & Procedure Ordered: Orders Placed This Encounter  Procedures  . CELIAC PLEXUS BLOCK    Standing Status: Standing     Number of Occurrences: 1     Standing Expiration Date: 03/24/2016    Scheduling Instructions:     Side: Right-sided     Sedation: With Sedation.     Timeframe: PRN Procedure. Patient will call.    Order Specific Question:  Where will this procedure be performed?    Answer:  [facility]  . ToxASSURE Select 13 (MW), Urine    Volume: 30 ml(s). Minimum 3 ml of urine is needed. Document temperature of fresh sample. Indications: Long term (current) use of opiate analgesic (Z79.891)  . Genetic Screening    Standing Status: Future     Number of Occurrences:      Standing Expiration Date: 04/24/2015    Imaging Ordered: None  Interventional Therapies: Scheduled: None at this time. PRN Procedures: Diagnostic celiac plexus block under fluoroscopic guidance and IV sedation.    Referral(s) or Consult(s): None at this time.  Medications administered during this visit: Ms. Shader had no medications administered during this visit.  Future Appointments Date Time Provider Department Center  06/24/2015 8:20 AM Delano Metz, MD [facility] None    Primary Care Physician: Domenic Schwab, FNP Location: [facility] Facility Note by: Sydnee Levans. Laban Emperor, M.D, DABA, DABAPM, DABPM, DABIPP, FIPP  Pain Score Disclaimer: We use the NRS-11 scale. This is a self-reported, subjective measurement of pain severity with only modest accuracy. It is used primarily to identify changes within a particular patient. It must be understood that outpatient pain scales are significantly less accurate that those used for research, where  they can be applied under ideal controlled circumstances with minimal exposure to variables. In reality, the score is likely to be a combination of pain intensity and pain affect, where pain affect describes the degree of emotional arousal or changes in action readiness caused by the sensory experience of pain. Factors such as social and work situation, setting, emotional state, anxiety levels, expectation, and prior pain experience may influence pain perception and show large inter-individual differences that may also be affected by time variables.

## 2015-03-25 NOTE — Progress Notes (Signed)
Went to ED after last appointment and had a CT scan for abdominal pain.

## 2015-03-25 NOTE — Patient Instructions (Signed)
Celiac Plexus Block Patient Information  Description: The celiac plexus is a group of nerves which are part of the sympathetic nervous system.  These nerves supply organs in the abdomen and pelvis.  Specific organs supplied with sensation by the celiac plexus include the stomach, liver, gallbladder, pancreas, kidneys and part of the gut.   The celiac plexus is located on both sides of the aorta at approximately the level of the first lumbar vertebral body.  The block will be performed with you lying on your abdomen with a pillow underneath.  Using direct x-ray guidance, the celiac plexus will be located on both sides of the spine.  Numbing medicine will be used to deaden the skin prior to needle insertion.  In most cases, a small amount of sedation can be given by IV prior to the numbing medicine.  Two small needles will be place near the celiac plexus and local anesthetic and steroid will be injected.  The entire block usually last about 15-25 minutes.  Conditions which may be treated by celiac plexus block:   Acute and chronic pancreatitis  Pain from liver or pancreatic cancer  Pain from Crohn's disease  Other types of abdominal or flank pain  Preparation for the injection:  1. Do not eat any solid food or dairy products within 8 hours of your appointment. 2. You may drink clear liquids up to 3 hours before appointment.  Clear liquids include water, black coffee, juice or soda.  No milk or cream please. 3. You may take your regular medication, including pain medications, with a sip of water before your appointment.  Diabetics should hold regular insulin (if taken separately) and take 1/2 normal NPH dose in the morning of the procedure.  Carry some sugar containing items with you to your appointment. 4. A driver must accompany you and be prepared to drive you home after your procedure. 5. Bring all your current medications with you. 6. An IV may be inserted and sedation may be given at the  discretion of the physician. 7. A blood pressure cuff, EKG, and other monitors will often be applied during the procedure.  Some patients may need to have extra oxygen administered for a short period. 8. You will be asked to provide medical information, including your allergies and medications, prior to the procedure.  We must know immediately if you are taking blood thinners (like Coumadin/Warfarin) or if you are allergic to IV iodine contrast (dye).  We must know if you could possible be pregnant.  Possible side-effects:   Bleeding from needle site or deeper  Infection (rarre, can require surgery)  Nerve injury (rare)  Numbness & tingling (temporary)  Collapsed lung (rare)  Spinal headache ( a headache worse with upright posture)  Light-headedness (temporary)  Pain at injection site (several days)  Decreased blood pressure (temporary)  Weakness in legs (temporary)  Seizure or other drug reaction (rare)  Call if you experience:   Fever/chills associated with headache or increased back/neck pain  Headache worsened by an upright position  New onset weakness or numbness of an extremity below the injection site.  Hives or difficulty breathing (go to the emergency room)  Inflammation or drainage at the injection site.  New onset diarrhea lasting more than 2 weeks.  New symptoms which are concerning to you  Please note:  If effective, we will often do a series of 2-3 injections spaced 3-6 weeks apart to maximally decrease your pain.  If initial series is effective, you may   be a candidate for a more permanent block of the celiac plexus. .  If you have questions, please call [phone] [facility]     

## 2015-03-30 LAB — TOXASSURE SELECT 13 (MW), URINE: PDF: 0

## 2015-06-24 ENCOUNTER — Ambulatory Visit: Payer: Self-pay | Admitting: Pain Medicine

## 2015-06-24 ENCOUNTER — Telehealth: Payer: Self-pay | Admitting: *Deleted
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
## 2015-06-25 NOTE — Telephone Encounter (Signed)
I'm confused because Dr. Laban EmperorNaveira schedule is booked for 07/24/2015. He has asked us several of times to not overbook him. Please advise a date and time so that the patient will not be out of her meds...thanks

## 2015-06-27 ENCOUNTER — Ambulatory Visit: Payer: BLUE CROSS/BLUE SHIELD | Attending: Pain Medicine | Admitting: Pain Medicine

## 2015-06-27 ENCOUNTER — Encounter: Payer: Self-pay | Admitting: Pain Medicine

## 2015-06-27 VITALS — BP 120/75 | HR 106 | Temp 98.5°F | Resp 16 | Ht 60.0 in | Wt 122.0 lb

## 2015-06-27 DIAGNOSIS — I10 Essential (primary) hypertension: Secondary | ICD-10-CM | POA: Diagnosis not present

## 2015-06-27 DIAGNOSIS — R1031 Right lower quadrant pain: Secondary | ICD-10-CM | POA: Diagnosis not present

## 2015-06-27 DIAGNOSIS — G8929 Other chronic pain: Secondary | ICD-10-CM | POA: Insufficient documentation

## 2015-06-27 DIAGNOSIS — R102 Pelvic and perineal pain: Secondary | ICD-10-CM | POA: Insufficient documentation

## 2015-06-27 DIAGNOSIS — N3010 Interstitial cystitis (chronic) without hematuria: Secondary | ICD-10-CM | POA: Diagnosis not present

## 2015-06-27 DIAGNOSIS — Z6823 Body mass index (BMI) 23.0-23.9, adult: Secondary | ICD-10-CM | POA: Diagnosis not present

## 2015-06-27 DIAGNOSIS — F1190 Opioid use, unspecified, uncomplicated: Secondary | ICD-10-CM

## 2015-06-27 DIAGNOSIS — N200 Calculus of kidney: Secondary | ICD-10-CM | POA: Insufficient documentation

## 2015-06-27 DIAGNOSIS — M25551 Pain in right hip: Secondary | ICD-10-CM | POA: Insufficient documentation

## 2015-06-27 DIAGNOSIS — E039 Hypothyroidism, unspecified: Secondary | ICD-10-CM | POA: Insufficient documentation

## 2015-06-27 DIAGNOSIS — R1011 Right upper quadrant pain: Secondary | ICD-10-CM

## 2015-06-27 DIAGNOSIS — Z79891 Long term (current) use of opiate analgesic: Secondary | ICD-10-CM | POA: Diagnosis not present

## 2015-06-27 DIAGNOSIS — Z5181 Encounter for therapeutic drug level monitoring: Secondary | ICD-10-CM | POA: Diagnosis not present

## 2015-06-27 DIAGNOSIS — F419 Anxiety disorder, unspecified: Secondary | ICD-10-CM | POA: Diagnosis not present

## 2015-06-27 DIAGNOSIS — F339 Major depressive disorder, recurrent, unspecified: Secondary | ICD-10-CM | POA: Insufficient documentation

## 2015-06-27 DIAGNOSIS — M545 Low back pain: Secondary | ICD-10-CM | POA: Diagnosis not present

## 2015-06-27 DIAGNOSIS — Z9882 Breast implant status: Secondary | ICD-10-CM | POA: Diagnosis not present

## 2015-06-27 DIAGNOSIS — R109 Unspecified abdominal pain: Secondary | ICD-10-CM | POA: Insufficient documentation

## 2015-06-27 MED ORDER — OXYCODONE HCL 10 MG PO TABS: 10.0000 mg | ORAL_TABLET | Freq: Four times a day (QID) | ORAL | Status: DC | PRN

## 2015-06-27 MED ORDER — METHADONE HCL 10 MG PO TABS: 10.0000 mg | ORAL_TABLET | Freq: Four times a day (QID) | ORAL | Status: DC

## 2015-06-27 NOTE — Progress Notes (Signed)
Patient's Name: [name]  Patient type: Established  MRN: [id number]  Service setting: Ambulatory outpatient  DOB: 1975-05-02  Location: [facility]  DOS: 06/27/2015  Primary Care Physician: Domenic Schwab, FNP  Note by: Sydnee Levans. Laban Emperor, M.D, DABA, DABAPM, DABPM, Olga Coaster, FIPP  Referring Physician: Fernande Bras*  Specialty: Board-Certified Interventional Pain Management  Last Visit to Pain Management: 06/24/2015   Primary Reason(s) for Visit: Encounter for prescription drug management (Level of risk: moderate) CC: Flank Pain and Abdominal Pain   HPI  [name] is a 40 y.o. year old, female patient, who returns today as an established patient. She has Major depressive disorder, recurrent severe without psychotic features (HCC); Chronic pain; Anxiety state; Continuous opioid dependence (HCC); Cannot sleep; Affective disorder (HCC); Calculus of kidney; Long term current use of opiate analgesic; Long term prescription opiate use; Opiate use (380 MME/Day); Encounter for therapeutic drug level monitoring; Encounter for chronic pain management; Chronic low back pain (Location of Primary Source of Pain) (Bilateral) (R>L); Osteoarthrosis; Episodic mood disorder (HCC); Opioid dependence (HCC); Methadone dependence (HCC); Methadone use (HCC); History of methadone use (HCC); Pain due to interstitial cystitis; Chronic interstitial cystitis; Chronic pelvic pain (Location of Secondary source of pain) (suprapubic) (Bilateral) (R>L); Chronic hip pain (Right); Obesity, Class I, BMI 30-34.9 (68% higher incidence of chronic low back pain); History of migraine; Encounter for long-term (current) use of other high-risk medications; Chronic groin pain (Location of Tertiary source of pain) (Right); Chronic abdominal pain (Location of Primary Source of Pain) (RLQ: right lower quadrant) (Right); and Chronic flank pain (Location of Secondary source of pain) (Right) on her  problem list.. Her primarily concern today is the Flank Pain and Abdominal Pain   Pain Assessment: Self-Reported Pain Score: 5 , clinically she looks like a 0-1/10 Reported level is inconsistent with clinical obrservations Information on the proper use of the pain score provided to the patient today. Pain Type: Chronic pain Pain Location: Abdomen Pain Orientation: Right Pain Descriptors / Indicators: Aching, Stabbing, Burning Pain Frequency: Constant  The patient comes into the clinics today for pharmacological management of her chronic pain. I last saw this patient on 06/24/2015. The patient  reports that she does not use illicit drugs. Her body mass index is 23.83 kg/(m^2).   The patient returns to day for pharmacological reevaluation and medication refill. Today I took time to go to "care everywhere" and I have reviewed all of her imaging studies. I also reviewed her history and based on that, today's physical exam would suggest pain to be, from the lumbar spine and hip joint. She still complains about abdominal pains that remain difficult to assess. However I will start working on some diagnostic blocks to narrow down its etiology.  In reviewing her medications, was seems to be clear to me is the fact that she has a significant amount of tolerance that she has developed over the years. Her pathology does not seem to justify the use of 40 mg of methadone +40 mg of oxycodone per day for a total of 380 MME/Day. I have had a very long conversation with this patient and she realizes that this cannot continue. The patient was informed that starting on her next visit, we will develop a tapering schedule for narcotics. The goal will be to completely come off of all narcotics. The first step will be to decrease the methadone by 5 mg every 7 days, followed by a decrease in the oxycodone by 5 mg every 7  days. This should take a total of 14 weeks to complete. At this particular speed, it is highly unlikely  that she will develop a significant withdrawal symptoms.  Date of Last Visit: 03/25/15 Service Provided on Last Visit: Med Refill  Controlled Substance Pharmacotherapy Assessment & REMS (Risk Evaluation and Mitigation Strategy)  Analgesic: Methadone 10 mg every 6 hours (40 mg/day) plus oxycodone IR 10 mg every 6 hours (40 mg/day) MME/day: 380 mg/day Pill Count: Did not bring medication bottles to this appointment. Pharmacokinetics: Onset of action (Liberation/Absorption): Within expected pharmacological parameters Time to Peak effect (Distribution): Timing and results are as within normal expected parameters Duration of action (Metabolism/Excretion): Within normal limits for medication Pharmacodynamics: Analgesic Effect: More than 50% Activity Facilitation: Medication(s) allow patient to sit, stand, walk, and do the basic ADLs Perceived Effectiveness: Described as relatively effective, allowing for increase in activities of daily living (ADL) Side-effects or Adverse reactions: None reported Monitoring: [place] PMP: Online review of the past 47-month period conducted. Compliant with practice rules and regulations Last UDS on record: TOXASSURE SELECT 13  Date Value Ref Range Status  03/25/2015 FINAL  Final    Comment:    ==================================================================== TOXASSURE SELECT 13 (MW) ==================================================================== Test                             Result       Flag       Units Drug Present and Declared for Prescription Verification   Methadone                      246          EXPECTED   ng/mg creat   EDDP (Methadone Mtb)           1326         EXPECTED   ng/mg creat    Sources of methadone include scheduled prescription medications.    EDDP is an expected metabolite of methadone. Drug Absent but Declared for Prescription Verification   Clonazepam                     Not Detected UNEXPECTED ng/mg creat   Oxycodone                       Not Detected UNEXPECTED ng/mg creat    Not detected result may be consistent with the time of last use    noted for this medication. ==================================================================== Test                      Result    Flag   Units      Ref Range   Creatinine              54               mg/dL      >=16 ==================================================================== Declared Medications:  The flagging and interpretation on this report are based on the  following declared medications.  Unexpected results may arise from  inaccuracies in the declared medications.  **Note: The testing scope of this panel includes these medications:  Clonazepam (Klonopin)  Methadone (Dolophine)  Oxycodone 5 DAYS  **Note: The testing scope of this panel does not include following  reported medications:  Albuterol (Proventil)  Cephalexin (Keflex)  Gabapentin (Neurontin)  Meloxicam (Mobic)  Naloxone  Ondansetron (Zofran)  Quetiapine (Seroquel) ==================================================================== For clinical consultation,  please call [phone]. ====================================================================    UDS interpretation: Compliant Patient informed of the CDC guidelines and recommendations to stay away from the concomitant use of benzodiazepines and opioids due to the increased risk of respiratory depression and death. Medication Assessment Form: Reviewed. Patient indicates being compliant with therapy Treatment compliance: Compliant Risk Assessment: Aberrant Behavior: None observed today Substance Use Disorder (SUD) Risk Level: Low-to-moderate Risk of opioid abuse or dependence: 0.7-3.0% with doses ? 36 MME/day and 6.1-26% with doses ? 120 MME/day. Opioid Risk Tool (ORT) Score: Total Score: 1 Low Risk for SUD (Score <3) Depression Scale Score: PHQ-2: PHQ-2 Total Score: 0 No depression (0) PHQ-9: PHQ-9 Total Score: 0 No depression  (0-4)  Pharmacologic Plan: No change in therapy, at this time  Laboratory Chemistry  Inflammation Markers Lab Results  Component Value Date   ESRSEDRATE 7 02/25/2015   CRP <0.5 02/25/2015    Renal Function Lab Results  Component Value Date   BUN 16 02/25/2015   CREATININE 0.84 02/25/2015   GFRAA >60 02/25/2015   GFRNONAA >60 02/25/2015    Hepatic Function Lab Results  Component Value Date   AST 22 02/25/2015   ALT 24 02/25/2015   ALBUMIN 4.3 02/25/2015    Electrolytes Lab Results  Component Value Date   NA 139 02/25/2015   K 3.3* 02/25/2015   CL 106 02/25/2015   CALCIUM 9.8 02/25/2015   MG 2.2 02/25/2015    Pain Modulating Vitamins No results found for: VD25OH, VD125OH2TOT, UJ8119JY7, [id number], VITAMINB12  Coagulation Parameters Lab Results  Component Value Date   PLT 319 02/25/2015   Note: Labs Reviewed.  Recent Diagnostic Imaging  Ct Abdomen Pelvis W Contrast  02/25/2015  CLINICAL DATA:  40 year old with diagnosis of urinary tract infection three days ago, presenting again today with worsening low back and right flank pain. Surgical history includes appendectomy and hysterectomy. EXAM: CT ABDOMEN AND PELVIS WITH CONTRAST TECHNIQUE: Multidetector CT imaging of the abdomen and pelvis was performed using the standard protocol following bolus administration of intravenous contrast. CONTRAST:  75mL OMNIPAQUE IOHEXOL 300 MG/ML IV. COMPARISON:  Unenhanced CT abdomen and pelvis 02/22/2015. Enhanced CT abdomen and pelvis 12/07/2013, 08/15/2011, 10/27/2010. FINDINGS: Lower chest:  Heart size normal.  Visualized lung bases clear. Hepatobiliary: Liver normal in size and appearance. Gallbladder normal in appearance without calcified gallstones. No biliary ductal dilation. Pancreas: Normal in appearance without evidence of mass, ductal dilation, or inflammation. Spleen: Normal in size and appearance. Focus of accessory splenic tissue medial to the spleen just below the hilum.  Adrenals/Urinary Tract: Normal appearing adrenal glands. Kidneys normal in size and appearance without focal parenchymal abnormality. No evidence of urinary tract calculi or obstruction. Normal-appearing urinary bladder. Stomach/Bowel: Stomach normal in appearance for the degree of distention. Normal-appearing small bowel. Entire colon decompressed and unremarkable. Appendix surgically absent. Vascular/Lymphatic: No visible aortoiliofemoral atherosclerosis. Widely patent visceral arteries. Normal-appearing portal venous and systemic venous systems. Enlarged left periaortic lymph node just above the the aortic bifurcation measuring approximately 2.1 x 2.1 x 2.3 cm. No pathologic lymphadenopathy elsewhere in the abdomen or pelvis. Reproductive: Surgically absent uterus. No adnexal masses or free fluid. Other: Bilateral breast implants as noted previously. Musculoskeletal: Regional skeleton intact without acute or significant osseous abnormality. IMPRESSION: 1. No evidence of urinary tract calculi, obstruction, or pyelonephritis. 2. Solitary enlarged left periaortic lymph node measuring 2.3 cm. This has increased in size since December, 2015, and may be reactive or neoplastic. 3. Otherwise normal examination. Electronically Signed   By: Maisie Fus  Lawrence M.D.   On: 02/25/2015 21:07   Knee Imaging: Knee-R MR wo contrast:  Results for orders placed in visit on 10/17/04  MR Knee Right Wo Contrast   Narrative * PRIOR REPORT IMPORTED FROM AN EXTERNAL SYSTEM *   PRIOR REPORT IMPORTED FROM THE [employer] WORKFLOW SYSTEM   REASON FOR EXAM:  RT knee pain  COMMENTS:   PROCEDURE:     MR  - MR KNEE RT  WO  CONTRAST  - Oct 17 2004  8:27AM   RESULT:     Multiplanar/multisequence imaging of the RIGHT knee is  obtained.   Quadriceps and patellar tendons are intact.  Menisci are intact.  Collateral ligaments are intact.  Mild anterior cruciate ligamentous  sprain  is present.  Posterior cruciate ligament is intact.    IMPRESSION:   Anterior cruciate ligamentous sprain. This is mild.   No other focal abnormalities are identified.   Thank you for this opportunity to contribute to the care of your patient.       Note: Imaging reviewed.  Meds  The patient has a current medication list which includes the following prescription(s): buspirone, clonidine, methadone, methadone, methadone, naloxone hcl, ondansetron, oxycodone hcl, oxycodone hcl, oxycodone hcl, and quetiapine.  Current Outpatient Prescriptions on File Prior to Visit  Medication Sig  . Naloxone HCl (NARCAN) 4 MG/0.1ML LIQD Place 1 spray into the nose once. Spray half of bottle content into each nostril, then call 911  . ondansetron (ZOFRAN ODT) 8 MG disintegrating tablet Take 1 tablet (8 mg total) by mouth every 8 (eight) hours as needed for nausea.  Marland Kitchen QUEtiapine (SEROQUEL) 50 MG tablet TAKE 1 TABLET EACH NIGHT AT BEDTIME   No current facility-administered medications on file prior to visit.    ROS  Constitutional: Denies any fever or chills Gastrointestinal: No reported hemesis, hematochezia, vomiting, or acute GI distress Musculoskeletal: Denies any acute onset joint swelling, redness, loss of ROM, or weakness Neurological: No reported episodes of acute onset apraxia, aphasia, dysarthria, agnosia, amnesia, paralysis, loss of coordination, or loss of consciousness  Allergies  Ms. Chadderdon is allergic to codeine; butorphanol tartrate; phenergan; promethazine; stadol; sumatriptan succinate; topamax; toradol; tramadol; and sulfa antibiotics.  PFSH  Medical:  Ms. Ressler  has a past medical history of Interstitial cystitis; Endometriosis; LBP (low back pain) (05/23/2013); Ache in joint (02/04/2013); Addiction, opium (HCC) (01/28/2012); Hypertension; Hypothyroidism; Kidney stones; Complication of anesthesia; and PONV (postoperative nausea and vomiting). Family: family history is not on file. Surgical:  has past surgical history that includes  Appendectomy; Abdominal hysterectomy; Cesarean section; carpal tunnel; Breast enhancement surgery; Diagnostic laparoscopy; and Carpal tunnel release (Left, 01/28/2015). Tobacco:  reports that she has never smoked. She does not have any smokeless tobacco history on file. Alcohol:  reports that she does not drink alcohol. Drug:  reports that she does not use illicit drugs.  Constitutional Exam  Vitals: Blood pressure 120/75, pulse 106, temperature 98.5 F (36.9 C), temperature source Oral, resp. rate 16, height 5' (1.524 m), weight 122 lb (55.339 kg), SpO2 100 %. General appearance: Well nourished, well developed, and well hydrated. In no acute distress Calculated BMI/Body habitus: Body mass index is 23.83 kg/(m^2). (18.5-24.9 kg/m2) Ideal body weight Psych/Mental status: Alert and oriented x 3 (person, place, & time) Eyes: PERLA Respiratory: No evidence of acute respiratory distress  Cervical Spine Exam  Inspection: No masses, redness, or swelling Alignment: Symmetrical ROM: Functional: ROM is within functional limits [facility]) Stability: No instability detected Muscle strength & Tone: Functionally  intact Sensory: Unimpaired Palpation: No complaints of tenderness  Upper Extremity (UE) Exam    Side: Right upper extremity  Side: Left upper extremity  Inspection: No masses, redness, swelling, or asymmetry  Inspection: No masses, redness, swelling, or asymmetry  ROM:  ROM:  Functional: ROM is within functional limits [facility](WFL)  Functional: ROM is within functional limits [facility](WFL)  Muscle strength & Tone: Functionally intact  Muscle strength & Tone: Functionally intact  Sensory: Unimpaired  Sensory: Unimpaired  Palpation: Non-contributory  Palpation: Non-contributory   Thoracic Spine Exam  Inspection: No masses, redness, or swelling Alignment: Symmetrical ROM: Functional: ROM is within functional limits [facility](WFL) Stability: No instability detected Sensory: Unimpaired Muscle strength & Tone:  Functionally intact Palpation: No complaints of tenderness  Lumbar Spine Exam  Inspection: No masses, redness, or swelling Alignment: Symmetrical ROM: Functional: Decreased ROM Stability: No instability detected Muscle strength & Tone: Functionally intact Sensory: Unimpaired Palpation: Tender Provocative Tests: Lumbar Hyperextension and rotation test: Positive for bilateral lumbar facet pain Patrick's Maneuver: Positive for right hip pain  Gait & Posture Assessment  Ambulation: Unassisted Gait: Unaffected Posture: WNL  Lower Extremity Exam    Side: Right lower extremity  Side: Left lower extremity  Inspection: No masses, redness, swelling, or asymmetry ROM:  Inspection: No masses, redness, swelling, or asymmetry ROM:  Functional: Reduced ROM for the hip   Functional: ROM is within functional limits [facility](WFL)  Muscle strength & Tone: Functionally intact  Muscle strength & Tone: Functionally intact  Sensory: Unimpaired  Sensory: Unimpaired  Palpation: Non-contributory  Palpation: Non-contributory   Assessment & Plan  Primary Diagnosis & Pertinent Problem List: The primary encounter diagnosis was Chronic pain. Diagnoses of Long term current use of opiate analgesic, Encounter for therapeutic drug level monitoring, Chronic abdominal pain (Location of Primary Source of Pain) (RLQ: right lower quadrant) (Right), Chronic hip pain (Right), Chronic flank pain (Location of Secondary source of pain) (Right), Chronic groin pain, right, Chronic low back pain (Location of Primary Source of Pain) (Bilateral) (R>L), and Opiate use (380 MME/Day) were also pertinent to this visit.  Visit Diagnosis: 1. Chronic pain   2. Long term current use of opiate analgesic   3. Encounter for therapeutic drug level monitoring   4. Chronic abdominal pain (Location of Primary Source of Pain) (RLQ: right lower quadrant) (Right)   5. Chronic hip pain (Right)   6. Chronic flank pain (Location of Secondary source of  pain) (Right)   7. Chronic groin pain, right   8. Chronic low back pain (Location of Primary Source of Pain) (Bilateral) (R>L)   9. Opiate use (380 MME/Day)     Problems updated and reviewed during this visit: Problem  Chronic hand pain (Left) (CTS) (Resolved)  Carpal tunnel syndrome (Left) (Sx on 01/28/2015) (Resolved)    Problem-specific Plan(s): No problem-specific assessment & plan notes found for this encounter.  No new assessment & plan notes have been filed under this hospital service since the last note was generated. Service: Pain Management   Plan of Care   Problem List Items Addressed This Visit      High   Chronic abdominal pain (Location of Primary Source of Pain) (RLQ: right lower quadrant) (Right) (Chronic)   Chronic flank pain (Location of Secondary source of pain) (Right) (Chronic)   Chronic groin pain (Location of Tertiary source of pain) (Right) (Chronic)   Relevant Medications   methadone (DOLOPHINE) 10 MG tablet   methadone (DOLOPHINE) 10 MG tablet   methadone (DOLOPHINE) 10 MG tablet  Oxycodone HCl 10 MG TABS   Oxycodone HCl 10 MG TABS   Oxycodone HCl 10 MG TABS   Other Relevant Orders   HIP INJECTION   Chronic hip pain (Right) (Chronic)   Relevant Orders   DG HIP UNILAT W OR W/O PELVIS 2-3 VIEWS RIGHT   HIP INJECTION   Chronic low back pain (Location of Primary Source of Pain) (Bilateral) (R>L) (Chronic)   Relevant Medications   methadone (DOLOPHINE) 10 MG tablet   methadone (DOLOPHINE) 10 MG tablet   methadone (DOLOPHINE) 10 MG tablet   Oxycodone HCl 10 MG TABS   Oxycodone HCl 10 MG TABS   Oxycodone HCl 10 MG TABS   Other Relevant Orders   LUMBAR FACET(MEDIAL BRANCH NERVE BLOCK) MBNB   DG Si Joints   DG Lumbar Spine Complete W/Bend   Chronic pain - Primary (Chronic)   Relevant Medications   methadone (DOLOPHINE) 10 MG tablet   methadone (DOLOPHINE) 10 MG tablet   methadone (DOLOPHINE) 10 MG tablet   Oxycodone HCl 10 MG TABS    Oxycodone HCl 10 MG TABS   Oxycodone HCl 10 MG TABS     Medium   Encounter for therapeutic drug level monitoring   Long term current use of opiate analgesic (Chronic)   Relevant Orders   ToxASSURE Select 13 (MW), Urine   Opiate use (380 MME/Day) (Chronic)       Pharmacotherapy (Medications Ordered): Meds ordered this encounter  Medications  . methadone (DOLOPHINE) 10 MG tablet    Sig: Take 1 tablet (10 mg total) by mouth every 6 (six) hours.    Dispense:  120 tablet    Refill:  0    Do not place this medication, or any other prescription from our practice, on "Automatic Refill". Patient may have prescription filled one day early if pharmacy is closed on scheduled refill date. Do not fill until: 06/27/15 To last until: 07/27/15  . methadone (DOLOPHINE) 10 MG tablet    Sig: Take 1 tablet (10 mg total) by mouth every 6 (six) hours.    Dispense:  120 tablet    Refill:  0    Do not place this medication, or any other prescription from our practice, on "Automatic Refill". Patient may have prescription filled one day early if pharmacy is closed on scheduled refill date. Do not fill until: 07/27/15 To last until: 08/26/15  . methadone (DOLOPHINE) 10 MG tablet    Sig: Take 1 tablet (10 mg total) by mouth every 6 (six) hours.    Dispense:  120 tablet    Refill:  0    Do not place this medication, or any other prescription from our practice, on "Automatic Refill". Patient may have prescription filled one day early if pharmacy is closed on scheduled refill date. Do not fill until: 08/26/15 To last until: 09/25/15  . Oxycodone HCl 10 MG TABS    Sig: Take 1 tablet (10 mg total) by mouth every 6 (six) hours as needed.    Dispense:  120 tablet    Refill:  0    Do not place this medication, or any other prescription from our practice, on "Automatic Refill". Patient may have prescription filled one day early if pharmacy is closed on scheduled refill date. Do not fill until: 06/27/15 To last  until: 07/27/15  . Oxycodone HCl 10 MG TABS    Sig: Take 1 tablet (10 mg total) by mouth every 6 (six) hours as needed.    Dispense:  120 tablet    Refill:  0    Do not place this medication, or any other prescription from our practice, on "Automatic Refill". Patient may have prescription filled one day early if pharmacy is closed on scheduled refill date. Do not fill until: 07/27/15 To last until: 08/26/15  . Oxycodone HCl 10 MG TABS    Sig: Take 1 tablet (10 mg total) by mouth every 6 (six) hours as needed.    Dispense:  120 tablet    Refill:  0    Do not place this medication, or any other prescription from our practice, on "Automatic Refill". Patient may have prescription filled one day early if pharmacy is closed on scheduled refill date. Do not fill until: 08/26/15 To last until: 09/25/15    [facility] Ordered: Orders Placed This Encounter  Procedures  . LUMBAR FACET(MEDIAL BRANCH NERVE BLOCK) MBNB  . HIP INJECTION  . DG HIP UNILAT W OR W/O PELVIS 2-3 VIEWS RIGHT  . DG Si Joints  . DG Lumbar Spine Complete W/Bend  . ToxASSURE Select 13 (MW), Urine    Imaging Ordered: DG HIP UNILAT W OR W/O PELVIS 2-3 VIEWS RIGHT DG SI JOINTS DG LUMBAR SPINE COMPLETE W/BEND 6+V  Interventional Therapies: Scheduled:  None at this time.    Considering:   1. Diagnostic intra-articular right hip injection under fluoroscopic guidance, with or without sedation.  2. Possible right hip radiofrequency ablation under fluoroscopic guidance and IV sedation, depending on the diagnostic injection.  3. Diagnostic bilateral lumbar facet block under fluoroscopic guidance and IV sedation.  4. Possible bilateral lumbar facet radiofrequency ablation under fluoroscopic guidance and IV sedation depending on the diagnostic injection.    PRN Procedures:   1. Diagnostic intra-articular right hip injection under fluoroscopic guidance, with or without sedation.  2. Diagnostic bilateral lumbar facet  block under fluoroscopic guidance and IV sedation.    Referral(s) or Consult(s): None at this time.  New Prescriptions   No medications on file    Medications administered during this visit: Ms. [name] had no medications administered during this visit.  Requested PM Follow-up: Return in about 3 months (around 09/16/2015) for Medication Management, (3-Mo).  Future Appointments Date Time Provider Department Center  09/16/2015 8:00 AM Delano MetzFrancisco Jeremi Losito[facility] None    Primary Care Physician: Domenic SchwabLindley,Cheryl Paulette, FNP [facility] Note by: Sydnee LevansFrancisco A. Laban EmperorNaveira, M.D, DABA, DABAPM, DABPM, DABIPP, FIPP  Pain Score Disclaimer: We use the NRS-11 scale. This is a self-reported, subjective measurement of pain severity with only modest accuracy. It is used primarily to identify changes within a particular patient. It must be understood that outpatient pain scales are significantly less accurate that those used for research, where they can be applied under ideal controlled circumstances with minimal exposure to variables. In reality, the score is likely to be a combination of pain intensity and pain affect, where pain affect describes the degree of emotional arousal or changes in action readiness caused by the sensory experience of pain. Factors such as social and work situation, setting, emotional state, anxiety levels, expectation, and prior pain experience may influence pain perception and show large inter-individual differences that may also be affected by time variables.  Patient instructions provided during this appointment: There are no Patient Instructions on file for this visit.

## 2015-06-27 NOTE — Progress Notes (Signed)
Safety precautions to be maintained throughout the outpatient stay will include: orient to surroundings, keep bed in low position, maintain call bell within reach at all times, provide assistance with transfer out of bed and ambulation.  Did not bring medication bottles to this appointment. 

## 2015-07-07 LAB — TOXASSURE SELECT 13 (MW), URINE: PDF: 0

## 2015-07-15 NOTE — Progress Notes (Signed)
Quick Note:  The Test: The urine drug screen (UDS) test used by our facility is a forensic state-of-the-art ultra-high performance liquid chromatography and mass spectrometry system (UPLC/MS-MS), the most sophisticated and accurate method currently available. It is 1,000 times more precise and accurate than standard gas chromatography and mass spectrometry (GC/MS) testing. The system can analyze 26 drug categories and 180 drug compounds. It will detect not only the parent compound but also its break down metabolites. It not only detects the presence of a compound, buts it also provides the amounts, down to nanograms.  The Findings: The findings of this test were reported as abnormal or unexpected, due to inconsistencies with expected results. Expectations are based on the medication history provided by the patient at the time of sample collection.  The Results: The following substance was absent, despite being reported as taken: Oxycodone  These results may suggest the following medication agreement violation(s): 1). Possible opioid diversion. (Selling, lending, or giving medications to an individual for whom they were not prescribed.) 2). Non-compliance with instructions on how to take the medication, including unsanctioned increase in medication intake leading to early depletion of supply.  The following unreported substance was present: Tramadol These results may suggest the following medication agreement violation(s):  1). Use of illegal substances. 2). Unreported source of medication. (i.e.: "Doctor shopping", illicit sources)  The Determination: The findings of this UDT are unacceptable and incompatible with appropriate, safe, and responsible use of controlled substances. This represents non-compliance with the safe use of controlled substances. We will no longer offer this therapy as a treatment option for this patient. ______

## 2015-07-21 ENCOUNTER — Encounter: Payer: Self-pay | Admitting: Emergency Medicine

## 2015-07-21 DIAGNOSIS — E039 Hypothyroidism, unspecified: Secondary | ICD-10-CM | POA: Insufficient documentation

## 2015-07-21 DIAGNOSIS — F332 Major depressive disorder, recurrent severe without psychotic features: Secondary | ICD-10-CM | POA: Insufficient documentation

## 2015-07-21 DIAGNOSIS — Z79899 Other long term (current) drug therapy: Secondary | ICD-10-CM | POA: Diagnosis not present

## 2015-07-21 DIAGNOSIS — N200 Calculus of kidney: Secondary | ICD-10-CM | POA: Diagnosis not present

## 2015-07-21 DIAGNOSIS — F1120 Opioid dependence, uncomplicated: Secondary | ICD-10-CM | POA: Insufficient documentation

## 2015-07-21 DIAGNOSIS — I10 Essential (primary) hypertension: Secondary | ICD-10-CM | POA: Insufficient documentation

## 2015-07-21 DIAGNOSIS — R109 Unspecified abdominal pain: Secondary | ICD-10-CM | POA: Diagnosis present

## 2015-07-21 LAB — URINALYSIS COMPLETE WITH MICROSCOPIC (ARMC ONLY)
BILIRUBIN URINE: NEGATIVE
Bacteria, UA: NONE SEEN
Glucose, UA: NEGATIVE mg/dL
KETONES UR: NEGATIVE mg/dL
Leukocytes, UA: NEGATIVE
NITRITE: NEGATIVE
PROTEIN: NEGATIVE mg/dL
SPECIFIC GRAVITY, URINE: 1.004 — AB (ref 1.005–1.030)
pH: 6 (ref 5.0–8.0)

## 2015-07-21 LAB — BASIC METABOLIC PANEL
Anion gap: 7 (ref 5–15)
BUN: 10 mg/dL (ref 6–20)
CALCIUM: 9.5 mg/dL (ref 8.9–10.3)
CO2: 25 mmol/L (ref 22–32)
CREATININE: 0.8 mg/dL (ref 0.44–1.00)
Chloride: 104 mmol/L (ref 101–111)
GFR calc Af Amer: >60 mL/min (ref >60)
GLUCOSE: 109 mg/dL — AB (ref 65–99)
Potassium: 3.8 mmol/L (ref 3.5–5.1)
Sodium: 136 mmol/L (ref 135–145)

## 2015-07-21 LAB — CBC
HCT: 40.3 % (ref 35.0–47.0)
Hemoglobin: 14 g/dL (ref 12.0–16.0)
MCH: 28.9 pg (ref 26.0–34.0)
MCHC: 34.8 g/dL (ref 32.0–36.0)
MCV: 82.9 fL (ref 80.0–100.0)
PLATELETS: 256 10*3/uL (ref 150–440)
RBC: 4.86 MIL/uL (ref 3.80–5.20)
RDW: 14.2 % (ref 11.5–14.5)
WBC: 10.6 10*3/uL (ref 3.6–11.0)

## 2015-07-21 NOTE — ED Notes (Signed)
Pt states history of renal calculi, cystitis. Pt complains of biltearal flank pain and pelvic pain. Pt states has had nausea, no vomiting and diarrhea. Pt appears in no acute distress in triage, pt denies known fever. Pt denies known hematuria, but does have urinary frequency without dysuira.

## 2015-07-22 ENCOUNTER — Emergency Department: Payer: BLUE CROSS/BLUE SHIELD

## 2015-07-22 ENCOUNTER — Emergency Department
Admission: EM | Admit: 2015-07-22 | Discharge: 2015-07-22 | Disposition: A | Discharge status: Home / Self Care | Payer: BLUE CROSS/BLUE SHIELD | Attending: Emergency Medicine | Admitting: Emergency Medicine

## 2015-07-22 DIAGNOSIS — N200 Calculus of kidney: Secondary | ICD-10-CM

## 2015-07-22 MED ORDER — ONDANSETRON HCL 4 MG/2ML IJ SOLN
4.0000 mg | Freq: Once | INTRAMUSCULAR | Status: AC
  Administered 2015-07-22: 4 mg via INTRAVENOUS
  Filled 2015-07-22: qty 2

## 2015-07-22 MED ORDER — ONDANSETRON 4 MG PO TBDP: 4.0000 mg | ORAL_TABLET | Freq: Three times a day (TID) | ORAL | Status: DC | PRN

## 2015-07-22 MED ORDER — MORPHINE SULFATE (PF) 4 MG/ML IV SOLN
4.0000 mg | Freq: Once | INTRAVENOUS | Status: AC
  Administered 2015-07-22: 4 mg via INTRAVENOUS
  Filled 2015-07-22: qty 1

## 2015-07-22 MED ORDER — OXYCODONE-ACETAMINOPHEN 5-325 MG PO TABS: 1.0000 | ORAL_TABLET | ORAL | Status: DC | PRN

## 2015-07-22 MED ORDER — OXYCODONE-ACETAMINOPHEN 5-325 MG PO TABS
1.0000 | ORAL_TABLET | Freq: Once | ORAL | Status: AC
  Administered 2015-07-22: 1 via ORAL
  Filled 2015-07-22: qty 1

## 2015-07-22 MED ORDER — OXYCODONE-ACETAMINOPHEN 5-325 MG PO TABS: 2.0000 | ORAL_TABLET | Freq: Once | ORAL | Status: DC

## 2015-07-22 MED ORDER — SODIUM CHLORIDE 0.9 % IV BOLUS (SEPSIS)
1000.0000 mL | Freq: Once | INTRAVENOUS | Status: AC
  Administered 2015-07-22: 1000 mL via INTRAVENOUS

## 2015-07-22 MED ORDER — TAMSULOSIN HCL 0.4 MG PO CAPS: 0.4000 mg | ORAL_CAPSULE | Freq: Every day | ORAL | Status: DC

## 2015-07-22 NOTE — Discharge Instructions (Signed)
Kidney Stones °Kidney stones (urolithiasis) are deposits that form inside your kidneys. The intense pain is caused by the stone moving through the urinary tract. When the stone moves, the ureter goes into spasm around the stone. The stone is usually passed in the urine.  °CAUSES  °· A disorder that makes certain neck glands produce too much parathyroid hormone (primary hyperparathyroidism). °· A buildup of uric acid crystals, similar to gout in your joints. °· Narrowing (stricture) of the ureter. °· A kidney obstruction present at birth (congenital obstruction). °· Previous surgery on the kidney or ureters. °· Numerous kidney infections. °SYMPTOMS  °· Feeling sick to your stomach (nauseous). °· Throwing up (vomiting). °· Blood in the urine (hematuria). °· Pain that usually spreads (radiates) to the groin. °· Frequency or urgency of urination. °DIAGNOSIS  °· Taking a history and physical exam. °· Blood or urine tests. °· CT scan. °· Occasionally, an examination of the inside of the urinary bladder (cystoscopy) is performed. °TREATMENT  °· Observation. °· Increasing your fluid intake. °· Extracorporeal shock wave lithotripsy--This is a noninvasive procedure that uses shock waves to break up kidney stones. °· Surgery may be needed if you have severe pain or persistent obstruction. There are various surgical procedures. Most of the procedures are performed with the use of small instruments. Only small incisions are needed to accommodate these instruments, so recovery time is minimized. °The size, location, and chemical composition are all important variables that will determine the proper choice of action for you. Talk to your health care provider to better understand your situation so that you will minimize the risk of injury to yourself and your kidney.  °HOME CARE INSTRUCTIONS  °· Drink enough water and fluids to keep your urine clear or pale yellow. This will help you to pass the stone or stone fragments. °· Strain  all urine through the provided strainer. Keep all particulate matter and stones for your health care provider to see. The stone causing the pain may be as small as a grain of salt. It is very important to use the strainer each and every time you pass your urine. The collection of your stone will allow your health care provider to analyze it and verify that a stone has actually passed. The stone analysis will often identify what you can do to reduce the incidence of recurrences. °· Only take over-the-counter or prescription medicines for pain, discomfort, or fever as directed by your health care provider. °· Keep all follow-up visits as told by your health care provider. This is important. °· Get follow-up X-rays if required. The absence of pain does not always mean that the stone has passed. It may have only stopped moving. If the urine remains completely obstructed, it can cause loss of kidney function or even complete destruction of the kidney. It is your responsibility to make sure X-rays and follow-ups are completed. Ultrasounds of the kidney can show blockages and the status of the kidney. Ultrasounds are not associated with any radiation and can be performed easily in a matter of minutes. °· Make changes to your daily diet as told by your health care provider. You may be told to: °¨ Limit the amount of salt that you eat. °¨ Eat 5 or more servings of fruits and vegetables each day. °¨ Limit the amount of meat, poultry, fish, and eggs that you eat. °· Collect a 24-hour urine sample as told by your health care provider. You may need to collect another urine sample every 6-12   months. °SEEK MEDICAL CARE IF: °· You experience pain that is progressive and unresponsive to any pain medicine you have been prescribed. °SEEK IMMEDIATE MEDICAL CARE IF:  °· Pain cannot be controlled with the prescribed medicine. °· You have a fever or shaking chills. °· The severity or intensity of pain increases over 18 hours and is not  relieved by pain medicine. °· You develop a new onset of abdominal pain. °· You feel faint or pass out. °· You are unable to urinate. °  °This information is not intended to replace advice given to you by your health care provider. Make sure you discuss any questions you have with your health care provider. °  °Document Released: 12/22/2004 Document Revised: 09/12/2014 Document Reviewed: 05/25/2012 °Elsevier Interactive Patient Education ©2016 [employer] ° °

## 2015-07-22 NOTE — ED Provider Notes (Signed)
[facility] Emergency Department Provider Note  ____________________________________________  Time seen: 12:50 AM  I have reviewed the triage vital signs and the nursing notes.   HISTORY  Chief Complaint Flank Pain      HPI [name] is a 40 y.o. female and essential cystitis and chronic history of kidney stones presents to the emergency department with bilateral flank pain is currently 7 out of 10 times one week. Patient also admits to pelvic discomfort. Patient denies any hematuria dysuria or urinary frequency or urgency. Patient denies any vomiting or diarrhea but does however admit to nausea.    Past Medical History  Diagnosis Date  . Interstitial cystitis   . Endometriosis   . LBP (low back pain) 05/23/2013  . Ache in joint 02/04/2013  . Addiction, opium (HCC) 01/28/2012  . Hypertension     H/O-BEEN AT LEAST 5 YEARS  . Hypothyroidism   . Kidney stones     H/O CHRONIC KIDNEY STONE  . Complication of anesthesia   . PONV (postoperative nausea and vomiting)     Patient Active Problem List   Diagnosis Date Noted  . Chronic abdominal pain (Location of Primary Source of Pain) (RLQ: right lower quadrant) (Right) 03/25/2015  . Chronic flank pain (Location of Secondary source of pain) (Right) 03/25/2015  . Chronic groin pain (Location of Tertiary source of pain) (Right) 03/01/2015  . Chronic pain 11/27/2014  . Long term current use of opiate analgesic 11/27/2014  . Long term prescription opiate use 11/27/2014  . Opiate use (380 MME/Day) 11/27/2014  . Encounter for therapeutic drug level monitoring 11/27/2014  . Encounter for chronic pain management 11/27/2014  . Chronic low back pain (Location of Primary Source of Pain) (Bilateral) (R>L) 11/27/2014  . Osteoarthrosis 11/27/2014  . 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 Methadone use (HCC) 11/27/2014  . History of methadone use (HCC) 11/27/2014  . Pain due to interstitial cystitis  11/27/2014  . Chronic interstitial cystitis 11/27/2014  . Chronic pelvic pain (Location of Secondary source of pain) (suprapubic) (Bilateral) (R>L) 11/27/2014  . Chronic hip pain (Right) 11/27/2014  . Obesity, Class I, BMI 30-34.9 (68% higher incidence of chronic low back pain) 11/27/2014  . History of migraine 11/27/2014  . Encounter for long-term (current) use of other high-risk medications 11/27/2014  . 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 Cannot sleep 02/04/2013  . Affective disorder (HCC) 06/16/2012  . Episodic mood disorder (HCC) 06/16/2012  . Anxiety state 01/28/2012  . Opioid dependence (HCC) 01/28/2012  . Continuous opioid dependence (HCC) 03/31/2011  . Calculus of kidney 03/31/2011    Past Surgical History  Procedure Laterality Date  . Appendectomy    . Abdominal hysterectomy    . Cesarean section      X2  . Carpal tunnel    . Breast enhancement surgery    . Diagnostic laparoscopy    . Carpal tunnel release Left 01/28/2015    Procedure: CARPAL TUNNEL RELEASE;  Surgeon: Deeann Saint, MD;  Location: [facility] ORS;  Service: Orthopedics;  Laterality: Left;    Current Outpatient Rx  Name  Route  Sig  Dispense  Refill  . busPIRone (BUSPAR) 5 MG tablet   Oral   Take 5 mg by mouth 3 (three) times daily.         . cloNIDine (CATAPRES) 0.1 MG tablet   Oral   Take 0.1 mg by mouth 3 (three) times daily.         Marland Kitchen  methadone (DOLOPHINE) 10 MG tablet   Oral   Take 1 tablet (10 mg total) by mouth every 6 (six) hours.   120 tablet   0     Do not place this medication, or any other prescri ...   . methadone (DOLOPHINE) 10 MG tablet   Oral   Take 1 tablet (10 mg total) by mouth every 6 (six) hours.   120 tablet   0     Do not place this medication, or any other prescri ...   . methadone (DOLOPHINE) 10 MG tablet   Oral   Take 1 tablet (10 mg total) by mouth every 6 (six) hours.   120 tablet   0     Do not place  this medication, or any other prescri ...   . Naloxone HCl (NARCAN) 4 MG/0.1ML LIQD   Nasal   Place 1 spray into the nose once. Spray half of bottle content into each nostril, then call 911   2 each   0     Please instruct patient in the proper use of the m ...   . ondansetron (ZOFRAN ODT) 8 MG disintegrating tablet   Oral   Take 1 tablet (8 mg total) by mouth every 8 (eight) hours as needed for nausea.   20 tablet   0   . Oxycodone HCl 10 MG TABS   Oral   Take 1 tablet (10 mg total) by mouth every 6 (six) hours as needed.   120 tablet   0     Do not place this medication, or any other prescri ...   . Oxycodone HCl 10 MG TABS   Oral   Take 1 tablet (10 mg total) by mouth every 6 (six) hours as needed.   120 tablet   0     Do not place this medication, or any other prescri ...   . Oxycodone HCl 10 MG TABS   Oral   Take 1 tablet (10 mg total) by mouth every 6 (six) hours as needed.   120 tablet   0     Do not place this medication, or any other prescri ...   . QUEtiapine (SEROQUEL) 50 MG tablet      TAKE 1 TABLET EACH NIGHT AT BEDTIME      1     Allergies Codeine; Butorphanol tartrate; Phenergan; Promethazine; Stadol; Sumatriptan succinate; Topamax; Toradol; Tramadol; and Sulfa antibiotics  No family history on file.  Social History Social History  Substance Use Topics  . Smoking status: Never Smoker   . Smokeless tobacco: Never Used  . Alcohol Use: No    Review of Systems  Constitutional: Negative for fever. Eyes: Negative for visual changes. ENT: Negative for sore throat. Cardiovascular: Negative for chest pain. Respiratory: Negative for shortness of breath. Gastrointestinal: Positive for bilateral flank pain Genitourinary: Negative for dysuria. Musculoskeletal: Negative for back pain. Skin: Negative for rash. Neurological: Negative for headaches, focal weakness or numbness.  10-point ROS otherwise  negative.  ____________________________________________   PHYSICAL EXAM:  VITAL SIGNS: ED Triage Vitals  Enc Vitals Group     BP 07/21/15 2047 140/94 mmHg     Pulse Rate 07/21/15 2047 97     Resp 07/21/15 2047 16     Temp 07/21/15 2047 98.6 F (37 C)     Temp Source 07/21/15 2047 Oral     SpO2 07/21/15 2047 100 %     Weight 07/21/15 2047 122 lb (55.339 kg)  Height 07/21/15 2047 4\' 11"  (1.499 m)     Head Cir --      Peak Flow --      Pain Score 07/21/15 2048 7     Pain Loc --      Pain Edu? --      Excl. in GC? --      Constitutional: Alert and oriented.Apparent discomfort Eyes: Conjunctivae are normal. PERRL. Normal extraocular movements. ENT   Head: Normocephalic and atraumatic.   Nose: No congestion/rhinnorhea.   Mouth/Throat: Mucous membranes are moist.   Neck: No stridor. Hematological/Lymphatic/Immunilogical: No cervical lymphadenopathy. Cardiovascular: Normal rate, regular rhythm. Normal and symmetric distal pulses are present in all extremities. No murmurs, rubs, or gallops. Respiratory: Normal respiratory effort without tachypnea nor retractions. Breath sounds are clear and equal bilaterally. No wheezes/rales/rhonchi. Gastrointestinal: Soft and nontender. No distention. There is no CVA tenderness. Genitourinary: deferred Musculoskeletal: Nontender with normal range of motion in all extremities. No joint effusions.  No lower extremity tenderness nor edema. Neurologic:  Normal speech and language. No gross focal neurologic deficits are appreciated. Speech is normal.  Skin:  Skin is warm, dry and intact. No rash noted. Psychiatric: Mood and affect are normal. Speech and behavior are normal. Patient exhibits appropriate insight and judgment.  ____________________________________________    LABS (pertinent positives/negatives)  Labs Reviewed  URINALYSIS COMPLETEWITH MICROSCOPIC ([facility] ONLY) - Abnormal; Notable for the following:    Color, Urine  STRAW (*)    APPearance CLEAR (*)    Specific Gravity, Urine 1.004 (*)    Hgb urine dipstick 1+ (*)    Squamous Epithelial / LPF 0-5 (*)    All other components within normal limits  BASIC METABOLIC PANEL - Abnormal; Notable for the following:    Glucose, Bld 109 (*)    All other components within normal limits  CBC     RADIOLOGY    CT Renal Stone Study (Final result) Result time: 07/22/15 04:27:53   Final result by Rad Results In Interface (07/22/15 04:27:53)   Narrative:   CLINICAL DATA: Bilateral flank pain  EXAM: CT ABDOMEN AND PELVIS WITHOUT CONTRAST  TECHNIQUE: Multidetector CT imaging of the abdomen and pelvis was performed following the standard protocol without IV contrast.  COMPARISON: 02/25/2015  FINDINGS: Lower chest and abdominal wall: Partly seen breast implants, unremarkable where visualized.  Hepatobiliary: No focal liver abnormality.No evidence of biliary obstruction or stone.  Pancreas: Unremarkable.  Spleen: Unremarkable.  Adrenals/Urinary Tract: Negative adrenals. Moderate left hydroureteronephrosis secondary to 4 mm distal left ureteral stone. Full appearance of the right ureter without change or obstructive process. Focal renal cortical scarring right upper pole. Unremarkable bladder.  Stomach/Bowel: No obstruction or inflammation. Appendectomy. Moderate rectal stool distention. Mesorectum is mildly infiltrated.  Reproductive:Hysterectomy. No adnexal mass.  Vascular/Lymphatic: 2 cm left periaortic mass, below the renal vascular level, which has developed/enlarged since 2014. Patient's history of hypertension.  Other: No ascites or pneumoperitoneum.  Musculoskeletal: No acute abnormalities.  IMPRESSION: 1. Obstructing 4 mm distal left ureteral stone. 2. 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 Paraganglioma is a specific preprocedural consideration. 3. Mild fat haziness around  the stool distended rectum. Correlate for constipation symptoms.   Electronically Signed By: Marnee SpringJonathon Watts M.D. On: 07/22/2015 04:27        ECG Results      Procedures     INITIAL IMPRESSION / ASSESSMENT AND PLAN / ED COURSE  Pertinent labs & imaging results that were available during  my care of the patient were reviewed by me and considered in my medical decision making (see chart for details). Patient's pain controlled after multiple doses of IV morphine and nausea control status post multiple doses of Zofran. CT scan reveals left ureterolithiasis 4 mm stone in the distal ureter. Patient states that she seen Dr. Cammie Sickle office in the past and a such will be referred to Dr. Apolinar Junes  ____________________________________________   FINAL CLINICAL IMPRESSION(S) / ED DIAGNOSES  Final diagnoses:  Kidney stone on left side      Darci Current, MD 07/22/15 [phone]

## 2015-07-24 ENCOUNTER — Ambulatory Visit: Payer: Self-pay

## 2015-07-25 ENCOUNTER — Ambulatory Visit (INDEPENDENT_AMBULATORY_CARE_PROVIDER_SITE_OTHER): Payer: BLUE CROSS/BLUE SHIELD | Admitting: Urology

## 2015-07-25 ENCOUNTER — Encounter: Payer: Self-pay | Admitting: Urology

## 2015-07-25 VITALS — BP 121/83 | HR 90 | Ht 60.0 in | Wt 124.8 lb

## 2015-07-25 DIAGNOSIS — N200 Calculus of kidney: Secondary | ICD-10-CM

## 2015-07-25 LAB — URINALYSIS, COMPLETE
BILIRUBIN UA: NEGATIVE
GLUCOSE, UA: NEGATIVE
Ketones, UA: NEGATIVE
NITRITE UA: NEGATIVE
PH UA: 5.5 (ref 5.0–7.5)
PROTEIN UA: NEGATIVE
Specific Gravity, UA: 1.015 (ref 1.005–1.030)
UUROB: 0.2 mg/dL (ref 0.2–1.0)

## 2015-07-25 LAB — MICROSCOPIC EXAMINATION
Bacteria, UA: NONE SEEN
Epithelial Cells (non renal): >10 /hpf — AB (ref 0–10)

## 2015-07-25 MED ORDER — OXYCODONE-ACETAMINOPHEN 5-325 MG PO TABS: 1.0000 | ORAL_TABLET | ORAL | Status: DC | PRN

## 2015-07-25 MED ORDER — TAMSULOSIN HCL 0.4 MG PO CAPS: 0.4000 mg | ORAL_CAPSULE | Freq: Every day | ORAL | Status: DC

## 2015-07-25 NOTE — Progress Notes (Signed)
07/25/2015 12:28 PM   Susan BarriosMichelle F Flores 1975-03-11 [id number]  Referring provider: Domenic Schwabheryl Paulette Lindley, FNP [address] [place], [place] [id number]  Chief Complaint  Patient presents with  . Nephrolithiasis    HPI: The patient is a 40 year old female presents today to discuss her left ureteral colic this. His 4 mm in distal left ureter. Her pain is currently well controlled. She denies fevers. She has passed multiple stones in the past since the age of 89. She has had lithotripsy and ureteroscopy in the past.    PMH: Past Medical History  Diagnosis Date  . Interstitial cystitis   . Endometriosis   . LBP (low back pain) 05/23/2013  . Ache in joint 02/04/2013  . Addiction, opium (HCC) 01/28/2012  . Hypertension     H/O-BEEN AT LEAST 5 YEARS  . Hypothyroidism   . Kidney stones     H/O CHRONIC KIDNEY STONE  . Complication of anesthesia   . PONV (postoperative nausea and vomiting)     Surgical History: Past Surgical History  Procedure Laterality Date  . Appendectomy    . Abdominal hysterectomy    . Cesarean section      X2  . Carpal tunnel    . Breast enhancement surgery    . Diagnostic laparoscopy    . Carpal tunnel release Left 01/28/2015    Procedure: CARPAL TUNNEL RELEASE;  Surgeon: Deeann SaintHoward Miller, MD;  Location: [facility] ORS;  Service: Orthopedics;  Laterality: Left;    Home Medications:    Medication List       This list is accurate as of: 07/25/15 12:28 PM.  Always use your most recent med list.               busPIRone 5 MG tablet  Commonly known as:  BUSPAR  Take 5 mg by mouth 3 (three) times daily.     cloNIDine 0.1 MG tablet  Commonly known as:  CATAPRES  Take 0.1 mg by mouth 3 (three) times daily.     naloxone HCl 4 MG/0.1ML Liqd  Commonly known as:  NARCAN  Place 1 spray into the nose once. Spray half of bottle content into each nostril, then call 911     ondansetron 8 MG disintegrating tablet  Commonly known as:  ZOFRAN ODT  Take 1 tablet (8 mg  total) by mouth every 8 (eight) hours as needed for nausea.     ondansetron 4 MG disintegrating tablet  Commonly known as:  ZOFRAN ODT  Take 1 tablet (4 mg total) by mouth every 8 (eight) hours as needed for nausea or vomiting.     oxyCODONE-acetaminophen 5-325 MG tablet  Commonly known as:  PERCOCET/ROXICET  Take 1 tablet by mouth every 4 (four) hours as needed for severe pain.     oxyCODONE-acetaminophen 5-325 MG tablet  Commonly known as:  ROXICET  Take 1-2 tablets by mouth every 4 (four) hours as needed for severe pain.     QUEtiapine 50 MG tablet  Commonly known as:  SEROQUEL  Take 50 mg by mouth at bedtime.     QUEtiapine 100 MG tablet  Commonly known as:  SEROQUEL  Reported on 07/25/2015     tamsulosin 0.4 MG Caps capsule  Commonly known as:  FLOMAX  Take 1 capsule (0.4 mg total) by mouth daily after breakfast.     tamsulosin 0.4 MG Caps capsule  Commonly known as:  FLOMAX  Take 1 capsule (0.4 mg total) by mouth daily.  Allergies:  Allergies  Allergen Reactions  . Codeine     Other reaction(s): Other (See Comments) Other Reaction: OTHER REACTION - ITCHING, SWEL  . Butorphanol Tartrate Itching and Other (See Comments)    Hallucinations  . Promethazine     Other reaction(s): Other (See Comments) Other Reaction: ARM SPASMS Other reaction(s): Other (See Comments) Arm spasms  . Sumatriptan Succinate     Other reaction(s): Other (See Comments) Other Reaction: BURNING OF SKIN  . Topamax [Topiramate]     Muscles spams  . Toradol [Ketorolac Tromethamine] Other (See Comments)    Other reaction(s): Other (See Comments) Burns face Muscle spasms  . Tramadol     Other reaction(s): Other (See Comments) Other Reaction: IRRITABILITY AND INSOMNIA MUSCLE SPASMS IN ARMS  . Sulfa Antibiotics Rash    Family History: No family history on file.  Social History:  reports that she has never smoked. She has never used smokeless tobacco. She reports that she does not  drink alcohol or use illicit drugs.  ROS:                                        Physical Exam: BP 121/83 mmHg  Pulse 90  Ht 5' (1.524 m)  Wt 124 lb 12.8 oz (56.609 kg)  BMI 24.37 kg/m2  Constitutional:  Alert and oriented, No acute distress. HEENT: [place] AT, moist mucus membranes.  Trachea midline, no masses. Cardiovascular: No clubbing, cyanosis, or edema. Respiratory: Normal respiratory effort, no increased work of breathing. GI: Abdomen is soft, nontender, nondistended, no abdominal masses GU: No CVA tenderness.  Skin: No rashes, bruises or suspicious lesions. Lymph: No cervical or inguinal adenopathy. Neurologic: Grossly intact, no focal deficits, moving all 4 extremities. Psychiatric: Normal mood and affect.  Laboratory Data: Lab Results  Component Value Date   WBC 10.6 07/21/2015   HGB 14.0 07/21/2015   HCT 40.3 07/21/2015   MCV 82.9 07/21/2015   PLT 256 07/21/2015    Lab Results  Component Value Date   CREATININE 0.80 07/21/2015    No results found for: PSA  No results found for: TESTOSTERONE  No results found for: HGBA1C  Urinalysis    Component Value Date/Time   COLORURINE STRAW* 07/21/2015 2050   COLORURINE Yellow 01/02/2014 1040   APPEARANCEUR CLEAR* 07/21/2015 2050   APPEARANCEUR Hazy 01/02/2014 1040   LABSPEC 1.004* 07/21/2015 2050   LABSPEC 1.012 01/02/2014 1040   PHURINE 6.0 07/21/2015 2050   PHURINE 6.0 01/02/2014 1040   GLUCOSEU NEGATIVE 07/21/2015 2050   GLUCOSEU Negative 01/02/2014 1040   HGBUR 1+* 07/21/2015 2050   HGBUR 2+ 01/02/2014 1040   BILIRUBINUR NEGATIVE 07/21/2015 2050   BILIRUBINUR Negative 01/02/2014 1040   KETONESUR NEGATIVE 07/21/2015 2050   KETONESUR Negative 01/02/2014 1040   PROTEINUR NEGATIVE 07/21/2015 2050   PROTEINUR Negative 01/02/2014 1040   NITRITE NEGATIVE 07/21/2015 2050   NITRITE Negative 01/02/2014 1040   LEUKOCYTESUR NEGATIVE 07/21/2015 2050   LEUKOCYTESUR Trace 01/02/2014 1040     Pertinent Imaging: The patient has a 4 mm distal left ureteral calculus in review of her recent CT scan.   Assessment & Plan:   1. Distal left ureteral stone The patient would like to continue medical expulsive therapy at this time. This is a reasonable decision. I refilled her medications for Percocet and Flomax. She is also given a strainer. We will see her back in 2 weeks for  KUB prior. He has been instructed to contact the office if she has unexplained fevers or pain becomes uncontrolled.  Return in about 2 weeks (around 08/08/2015) for KUB prior.  Hildred Laser, MD  [facility] [address], [address] [place], [place] [address] [phone]

## 2015-07-26 ENCOUNTER — Telehealth: Payer: Self-pay

## 2015-07-26 ENCOUNTER — Telehealth: Payer: Self-pay | Admitting: Radiology

## 2015-07-26 NOTE — Telephone Encounter (Signed)
Pt c/o severe pain related to kidney stone. Feels she needs to return to ER. She is scheduled to RTC with KUB prior on 08/12/15. Please advise.

## 2015-07-26 NOTE — Telephone Encounter (Signed)
Notified pt of surgery scheduled 07/31/15, pre-admit phone interview on 07/30/15 between 1-5pm & to call day prior to surgery for arrival time to [facility]. Pt voices understanding.

## 2015-07-26 NOTE — Telephone Encounter (Signed)
Pt states she is allergic to toradol but would like to schedule surgery for 07/31/15. Advised pt to continue pain medications & return to ER if pain becomes uncontrollable. Pt voices understanding. Orders received from Dr Sherryl BartersBudzyn.

## 2015-07-26 NOTE — Telephone Encounter (Signed)
She can come to the office for a shot of toradol today. If that does not work, she will need to go to the ER.  We do not have operative time in the OR today.

## 2015-07-26 NOTE — Telephone Encounter (Signed)
Pt called again stating she is in a severe amount of pain and does not want to spend her weekend in the ER.

## 2015-07-29 NOTE — Telephone Encounter (Signed)
Spoke with pt in reference to nausea and vomiting. Pt stated that her nausea starts up after she eats and the vomiting comes and goes. Reinforced with pt to take medications and if that doesn't help then she will need to go to the ER. Pt voiced understanding.

## 2015-07-29 NOTE — Telephone Encounter (Signed)
If her nausea and vomiting is uncontrolled and unable to eat on zofran, she needs to go to the ER.

## 2015-07-30 ENCOUNTER — Encounter
Admission: RE | Admit: 2015-07-30 | Discharge: 2015-07-30 | Disposition: A | Discharge status: Home / Self Care | Payer: BLUE CROSS/BLUE SHIELD | Source: Ambulatory Visit | Attending: Urology | Admitting: Urology

## 2015-07-30 ENCOUNTER — Encounter: Payer: Self-pay | Admitting: *Deleted

## 2015-07-30 NOTE — Patient Instructions (Signed)
  Your procedure is scheduled on: 07-31-15 Report to Same Day Surgery 2nd floor medical mall To find out your arrival time please call [phone] between 1PM - 3PM on 07-30-15  Remember: Instructions that are not followed completely may result in serious medical risk, up to and including death, or upon the discretion of your surgeon and anesthesiologist your surgery may need to be rescheduled.    _x___ 1. Do not eat food or drink liquids after midnight. No gum chewing or hard candies.     __x__ 2. No Alcohol for 24 hours before or after surgery.   __x__3. No Smoking for 24 prior to surgery.   ____  4. Bring all medications with you on the day of surgery if instructed.    __x__ 5. Notify your doctor if there is any change in your medical condition     (cold, fever, infections).     Do not wear jewelry, make-up, hairpins, clips or nail polish.  Do not wear lotions, powders, or perfumes. You may wear deodorant.  Do not shave 48 hours prior to surgery. Men may shave face and neck.  Do not bring valuables to the hospital.    [facility] is not responsible for any belongings or valuables.               Contacts, dentures or bridgework may not be worn into surgery.  Leave your suitcase in the car. After surgery it may be brought to your room.  For patients admitted to the hospital, discharge time is determined by your treatment team.   Patients discharged the day of surgery will not be allowed to drive home.    Please read over the following fact sheets that you were given:   [facility] Preparing for Surgery and or MRSA Information   _x___ Take these medicines the morning of surgery with A SIP OF WATER:    1. BUSPAR  2. CLONIDINE  3. FLOMAX  4. MAY TAKE PERCOCET IF NEEDED  5.  6.  ____ Fleet Enema (as directed)   ____ Use CHG Soap or sage wipes as directed on instruction sheet   ____ Use inhalers on the day of surgery and bring to hospital day of surgery  ____ Stop  metformin 2 days prior to surgery    ____ Take 1/2 of usual insulin dose the night before surgery and none on the morning of surgery.   ____ Stop aspirin or coumadin, or plavix  _X__ Stop Anti-inflammatories such as Advil, Aleve, Ibuprofen, Motrin, Naproxen,          Naprosyn, Goodies powders or aspirin products. Ok to take Tylenol.   ____ Stop supplements until after surgery.    ____ Bring C-Pap to the hospital.

## 2015-07-31 ENCOUNTER — Encounter: Payer: Self-pay | Admitting: *Deleted

## 2015-07-31 ENCOUNTER — Ambulatory Visit: Payer: BLUE CROSS/BLUE SHIELD | Admitting: Anesthesiology

## 2015-07-31 ENCOUNTER — Ambulatory Visit
Admission: RE | Admit: 2015-07-31 | Discharge: 2015-07-31 | Disposition: A | Discharge status: Home / Self Care | Payer: BLUE CROSS/BLUE SHIELD | Source: Ambulatory Visit | Attending: Urology | Admitting: Urology

## 2015-07-31 ENCOUNTER — Encounter
Admission: RE | Disposition: A | Discharge status: Home / Self Care | Payer: Self-pay | Source: Ambulatory Visit | Attending: Urology

## 2015-07-31 DIAGNOSIS — N132 Hydronephrosis with renal and ureteral calculous obstruction: Secondary | ICD-10-CM | POA: Insufficient documentation

## 2015-07-31 DIAGNOSIS — I10 Essential (primary) hypertension: Secondary | ICD-10-CM | POA: Insufficient documentation

## 2015-07-31 DIAGNOSIS — N201 Calculus of ureter: Secondary | ICD-10-CM | POA: Diagnosis not present

## 2015-07-31 DIAGNOSIS — E039 Hypothyroidism, unspecified: Secondary | ICD-10-CM | POA: Diagnosis not present

## 2015-07-31 DIAGNOSIS — Z888 Allergy status to other drugs, medicaments and biological substances status: Secondary | ICD-10-CM | POA: Diagnosis not present

## 2015-07-31 DIAGNOSIS — Z882 Allergy status to sulfonamides status: Secondary | ICD-10-CM | POA: Diagnosis not present

## 2015-07-31 DIAGNOSIS — Z885 Allergy status to narcotic agent status: Secondary | ICD-10-CM | POA: Diagnosis not present

## 2015-07-31 DIAGNOSIS — Z90710 Acquired absence of both cervix and uterus: Secondary | ICD-10-CM | POA: Insufficient documentation

## 2015-07-31 DIAGNOSIS — Z79899 Other long term (current) drug therapy: Secondary | ICD-10-CM | POA: Insufficient documentation

## 2015-07-31 DIAGNOSIS — Z87442 Personal history of urinary calculi: Secondary | ICD-10-CM | POA: Insufficient documentation

## 2015-07-31 DIAGNOSIS — Z9049 Acquired absence of other specified parts of digestive tract: Secondary | ICD-10-CM | POA: Insufficient documentation

## 2015-07-31 DIAGNOSIS — Z79891 Long term (current) use of opiate analgesic: Secondary | ICD-10-CM | POA: Diagnosis not present

## 2015-07-31 DIAGNOSIS — Z9889 Other specified postprocedural states: Secondary | ICD-10-CM | POA: Insufficient documentation

## 2015-07-31 HISTORY — DX: Personal history of Methicillin resistant Staphylococcus aureus infection: Z86.14

## 2015-07-31 HISTORY — DX: Anemia, unspecified: D64.9

## 2015-07-31 HISTORY — DX: Cardiac murmur, unspecified: R01.1

## 2015-07-31 HISTORY — DX: Transient cerebral ischemic attack, unspecified: G45.9

## 2015-07-31 HISTORY — PX: URETEROSCOPY WITH HOLMIUM LASER LITHOTRIPSY: SHX6645

## 2015-07-31 HISTORY — PX: CYSTOSCOPY WITH STENT PLACEMENT: SHX5790

## 2015-07-31 HISTORY — DX: Family history of other specified conditions: Z84.89

## 2015-07-31 LAB — SURGICAL PCR SCREEN
MRSA, PCR: NEGATIVE
Staphylococcus aureus: POSITIVE — AB

## 2015-07-31 SURGERY — URETEROSCOPY, WITH LITHOTRIPSY USING HOLMIUM LASER
Anesthesia: General | Laterality: Left | Wound class: Clean Contaminated

## 2015-07-31 MED ORDER — DEXAMETHASONE SODIUM PHOSPHATE 10 MG/ML IJ SOLN
INTRAMUSCULAR | Status: DC | PRN
  Administered 2015-07-31: 10 mg via INTRAVENOUS

## 2015-07-31 MED ORDER — FENTANYL CITRATE (PF) 100 MCG/2ML IJ SOLN
INTRAMUSCULAR | Status: AC
  Administered 2015-07-31: 50 ug via INTRAVENOUS
  Filled 2015-07-31: qty 2

## 2015-07-31 MED ORDER — ONDANSETRON 4 MG PO TBDP: 4.0000 mg | ORAL_TABLET | Freq: Three times a day (TID) | ORAL | 0 refills | Status: AC | PRN

## 2015-07-31 MED ORDER — FAMOTIDINE 20 MG PO TABS
ORAL_TABLET | ORAL | Status: AC
  Administered 2015-07-31: 20 mg via ORAL
  Filled 2015-07-31: qty 1

## 2015-07-31 MED ORDER — PROPOFOL 10 MG/ML IV BOLUS
INTRAVENOUS | Status: DC | PRN
  Administered 2015-07-31: 30 mg via INTRAVENOUS
  Administered 2015-07-31: 150 mg via INTRAVENOUS

## 2015-07-31 MED ORDER — OXYCODONE-ACETAMINOPHEN 5-325 MG PO TABS: 1.0000 | ORAL_TABLET | ORAL | 0 refills | Status: DC | PRN

## 2015-07-31 MED ORDER — PROMETHAZINE HCL 25 MG/ML IJ SOLN: 6.2500 mg | INTRAMUSCULAR | Status: DC | PRN

## 2015-07-31 MED ORDER — MIDAZOLAM HCL 2 MG/2ML IJ SOLN
INTRAMUSCULAR | Status: DC | PRN
Start: 2015-07-31 — End: 2015-07-31
  Administered 2015-07-31: 2 mg via INTRAVENOUS

## 2015-07-31 MED ORDER — MEPERIDINE HCL 25 MG/ML IJ SOLN: 6.2500 mg | INTRAMUSCULAR | Status: DC | PRN

## 2015-07-31 MED ORDER — CEFAZOLIN SODIUM-DEXTROSE 2-4 GM/100ML-% IV SOLN
2.0000 g | Freq: Once | INTRAVENOUS | Status: AC
  Administered 2015-07-31: 2 g via INTRAVENOUS

## 2015-07-31 MED ORDER — ONDANSETRON HCL 4 MG/2ML IJ SOLN: INTRAMUSCULAR | Status: DC | PRN

## 2015-07-31 MED ORDER — CEPHALEXIN 500 MG PO CAPS: 500.0000 mg | ORAL_CAPSULE | Freq: Three times a day (TID) | ORAL | 0 refills | Status: DC

## 2015-07-31 MED ORDER — ONDANSETRON HCL 4 MG/2ML IJ SOLN
INTRAMUSCULAR | Status: DC | PRN
  Administered 2015-07-31: 4 mg via INTRAVENOUS

## 2015-07-31 MED ORDER — CEFAZOLIN SODIUM-DEXTROSE 2-4 GM/100ML-% IV SOLN
INTRAVENOUS | Status: AC
  Administered 2015-07-31: 2 g via INTRAVENOUS
  Filled 2015-07-31: qty 100

## 2015-07-31 MED ORDER — OXYCODONE HCL 5 MG/5ML PO SOLN: 5.0000 mg | Freq: Once | ORAL | Status: DC | PRN

## 2015-07-31 MED ORDER — FENTANYL CITRATE (PF) 100 MCG/2ML IJ SOLN
INTRAMUSCULAR | Status: DC | PRN
  Administered 2015-07-31 (×2): 50 ug via INTRAVENOUS

## 2015-07-31 MED ORDER — FAMOTIDINE 20 MG PO TABS
20.0000 mg | ORAL_TABLET | Freq: Once | ORAL | Status: AC
  Administered 2015-07-31: 20 mg via ORAL

## 2015-07-31 MED ORDER — LIDOCAINE HCL (CARDIAC) 20 MG/ML IV SOLN
INTRAVENOUS | Status: DC | PRN
  Administered 2015-07-31: 40 mg via INTRAVENOUS

## 2015-07-31 MED ORDER — FENTANYL CITRATE (PF) 100 MCG/2ML IJ SOLN
25.0000 ug | INTRAMUSCULAR | Status: DC | PRN
  Administered 2015-07-31 (×3): 50 ug via INTRAVENOUS

## 2015-07-31 MED ORDER — LACTATED RINGERS IV SOLN
INTRAVENOUS | Status: DC
  Administered 2015-07-31 (×2): via INTRAVENOUS

## 2015-07-31 MED ORDER — OXYCODONE HCL 5 MG PO TABS: 5.0000 mg | ORAL_TABLET | Freq: Once | ORAL | Status: DC | PRN

## 2015-07-31 SURGICAL SUPPLY — 31 items
BACTOSHIELD CHG 4% 4OZ (MISCELLANEOUS)
BASKET ZERO TIP 1.9FR (BASKET) ×3 IMPLANT
CATH URETL 5X70 OPEN END (CATHETERS) ×3 IMPLANT
CNTNR SPEC 2.5X3XGRAD LEK (MISCELLANEOUS) ×1
CONT SPEC 4OZ STER OR WHT (MISCELLANEOUS) ×2
CONTAINER SPEC 2.5X3XGRAD LEK (MISCELLANEOUS) ×1 IMPLANT
EXTRACTOR STONE TIPLESS (MISCELLANEOUS) ×3 IMPLANT
FEE TECHNICIAN ONLY PER HOUR (MISCELLANEOUS) IMPLANT
FIBER LASER LITHO 273 (Laser) ×3 IMPLANT
GLOVE BIO SURGEON STRL SZ7 (GLOVE) ×3 IMPLANT
GLOVE BIO SURGEON STRL SZ7.5 (GLOVE) ×3 IMPLANT
GOWN STRL REUS W/ TWL LRG LVL4 (GOWN DISPOSABLE) ×1 IMPLANT
GOWN STRL REUS W/TWL LRG LVL4 (GOWN DISPOSABLE) ×2
GOWN STRL REUS W/TWL XL LVL3 (GOWN DISPOSABLE) ×3 IMPLANT
GUIDEWIRE SUPER STIFF (WIRE) IMPLANT
INTRODUCER DILATOR DOUBLE (INTRODUCER) ×3 IMPLANT
KIT RM TURNOVER CYSTO AR (KITS) ×3 IMPLANT
LASER FIBER 200M SMARTSCOPE (Laser) IMPLANT
PACK CYSTO AR (MISCELLANEOUS) ×3 IMPLANT
SCRUB CHG 4% DYNA-HEX 4OZ (MISCELLANEOUS) IMPLANT
SENSORWIRE 0.038 NOT ANGLED (WIRE) ×3
SET CYSTO W/LG BORE CLAMP LF (SET/KITS/TRAYS/PACK) ×3 IMPLANT
SHEATH URETERAL 13/15X36 1L (SHEATH) IMPLANT
SOL .9 NS 3000ML IRR  AL (IV SOLUTION) ×2
SOL .9 NS 3000ML IRR UROMATIC (IV SOLUTION) ×1 IMPLANT
STENT URET 6FRX24 CONTOUR (STENTS) ×3 IMPLANT
STENT URET 6FRX26 CONTOUR (STENTS) IMPLANT
SURGILUBE 2OZ TUBE FLIPTOP (MISCELLANEOUS) ×3 IMPLANT
SYRINGE IRR TOOMEY STRL 70CC (SYRINGE) ×3 IMPLANT
WATER STERILE IRR 1000ML POUR (IV SOLUTION) ×3 IMPLANT
WIRE SENSOR 0.038 NOT ANGLED (WIRE) ×1 IMPLANT

## 2015-07-31 NOTE — Discharge Instructions (Signed)

## 2015-07-31 NOTE — Anesthesia Preprocedure Evaluation (Signed)
Anesthesia Evaluation  Patient identified by MRN, date of birth, ID band Patient awake    Reviewed: Allergy & Precautions, NPO status , Patient's Chart, lab work & pertinent test results  History of Anesthesia Complications (+) PONV and history of anesthetic complications  Airway Mallampati: II  TM Distance: >3 FB Neck ROM: Full    Dental no notable dental hx.    Pulmonary neg sleep apnea, neg COPD,    breath sounds clear to auscultation- rhonchi (-) wheezing      Cardiovascular hypertension, (-) CAD and (-) Past MI  Rhythm:Regular Rate:Normal - Systolic murmurs and - Diastolic murmurs    Neuro/Psych Anxiety Depression TIA (no deficits, attributed to electrolyte imbalance)   GI/Hepatic negative GI ROS, Neg liver ROS,   Endo/Other  neg diabetesHypothyroidism   Renal/GU Renal disease (nephrolithiasis)     Musculoskeletal  (+) Arthritis ,   Abdominal (+) - obese,   Peds  Hematology  (+) anemia ,   Anesthesia Other Findings Past Medical History: 02/04/2013: Ache in joint 01/28/2012: Addiction, opium (HCC) No date: Anemia     Comment: H/O No date: Complication of anesthesia No date: Endometriosis No date: Family history of adverse reaction to anesthes*     Comment: DAD-N/V, HEADACHES No date: Heart murmur 2012: History of methicillin resistant staphylococcu* No date: Hypertension     Comment: H/O-BEEN AT LEAST 5 YEARS No date: Hypothyroidism No date: Interstitial cystitis No date: Kidney stones     Comment: H/O CHRONIC KIDNEY STONE 05/23/2013: LBP (low back pain) No date: PONV (postoperative nausea and vomiting) 2007: TIA (transient ischemic attack)   Reproductive/Obstetrics                             Anesthesia Physical Anesthesia Plan  ASA: II  Anesthesia Plan: General   Post-op Pain Management:    Induction: Intravenous  Airway Management Planned: LMA  Additional  Equipment:   Intra-op Plan:   Post-operative Plan:   Informed Consent: I have reviewed the patients History and Physical, chart, labs and discussed the procedure including the risks, benefits and alternatives for the proposed anesthesia with the patient or authorized representative who has indicated his/her understanding and acceptance.   Dental advisory given  Plan Discussed with: CRNA and Anesthesiologist  Anesthesia Plan Comments:         Anesthesia Quick Evaluation

## 2015-07-31 NOTE — Transfer of Care (Signed)
Immediate Anesthesia Transfer of Care Note  Patient: [name]  Procedure(s) Performed: Procedure(s): URETEROSCOPY WITH HOLMIUM LASER LITHOTRIPSY (Left) CYSTOSCOPY WITH STENT PLACEMENT (Left)  Patient Location: PACU  Anesthesia Type:General  Level of Consciousness: awake  Airway & Oxygen Therapy: Patient Spontanous Breathing and Patient connected to face mask oxygen  Post-op Assessment: Report given to RN and Post -op Vital signs reviewed and stable  Post vital signs: Reviewed and stable  Last Vitals:  Vitals:   07/31/15 1156  BP: (!) 132/104  Pulse: 89  Resp: 20  Temp: 36.9 C    Last Pain:  Vitals:   07/31/15 1156  TempSrc: Oral  PainSc: 0-No pain      Patients Stated Pain Goal: 2 (07/31/15 1156)  Complications: No apparent anesthesia complications

## 2015-07-31 NOTE — Op Note (Signed)
Date of procedure: 07/31/15  Preoperative diagnosis:  1. Left ureteral calculus   Postoperative diagnosis:  1. Left ureteral calculus   Procedure: 1. Cystoscopy 2. Left ureteroscopy 3. Laser lithotripsy 4. Stone basketing 5. Left retrograde pyelogram with interpretation 6. Left ureteral stent placement 6 French by 24 cm  Surgeon: Baruch Gouty, MD  Anesthesia: General  Complications: None  Intraoperative findings: The patient had a impacted 4 mm stone in the distal ureter significant hydroureteronephrosis proximal. The stone was removed with a stone basket after being fragmented with laser lithotripsy. A left ureteral stent was placed after left retrograde program showed no filling defects. There is good drainage of urine from the left ureteral stent at the end the procedure.  EBL: None  Specimens: Left ureteral stone  Drains: 6 French by 26 cm double-J left ureteral stent  Disposition: Stable to the postanesthesia care unit  Indication for procedure: The patient is a 40 y.o. female with a left distal ureteral calculus he failed medical expulsive therapy. She presents today for definitive stone management.  After reviewing the management options for treatment, the patient elected to proceed with the above surgical procedure(s). We have discussed the potential benefits and risks of the procedure, side effects of the proposed treatment, the likelihood of the patient achieving the goals of the procedure, and any potential problems that might occur during the procedure or recuperation. Informed consent has been obtained.  Description of procedure: The patient was met in the preoperative area. All risks, benefits, and indications of the procedure were described in great detail. The patient consented to the procedure. Preoperative antibiotics were given. The patient was taken to the operative theater. General anesthesia was induced per the anesthesia service. The patient was then placed  in the dorsal lithotomy position and prepped and draped in the usual sterile fashion. A preoperative timeout was called.   A 21 French 30 cystoscope was inserted into the patient's bladder per urethra atraumatically. Left ear orifice is intubated the sensor wire. With some difficulty sensor wires able to be passed alongside in the left ureteral stone in the distal ureter up to the left renal pelvis. Surgery risks was inserted in the patient's bladder and distal ureter. The stone was noted to be impacted in the distal ureter. It was broken into fragments laser lithotripsy. This point the ureters was easily able to be passed into the more proximal ureter. The stones were removed for stone basket. Pan ureteroscopy revealed no more stones. Left retropyelogram showed resolving left hydroureteronephrosis with no filling defects. The ureteroscope was removed. A 6 French by Stovall double-J ureteral stent was then placed in the cystoscope into the left ureter. The sensor wire was removed. A curl seen in the patient's renal pelvis proximally and a curl urinary bladder distally. There was a very good drainage of hydronephrosis at this point. This was clear yellow urine. The patient bladder was then drained. She was transferred stable condition to postanesthesia care unit.  Plan: She will follow-up in one week for cystoscopy with stent removal in the office. She'll need a renal ultrasound 1 month for iatrogenic hydronephrosis. Her stone will be sent for analysis. She may benefit from a 24-hour urine collection due to recurrent nephrolithiasis.  Baruch Gouty, M.D.

## 2015-07-31 NOTE — Anesthesia Postprocedure Evaluation (Signed)
Anesthesia Post Note  Patient: [name]  Procedure(s) Performed: Procedure(s) (LRB): URETEROSCOPY WITH HOLMIUM LASER LITHOTRIPSY (Left) CYSTOSCOPY WITH STENT PLACEMENT (Left)  Patient location during evaluation: PACU Anesthesia Type: General Level of consciousness: awake and alert and oriented Pain management: pain level controlled Vital Signs Assessment: post-procedure vital signs reviewed and stable Respiratory status: spontaneous breathing, nonlabored ventilation and respiratory function stable Cardiovascular status: blood pressure returned to baseline and stable Postop Assessment: no signs of nausea or vomiting Anesthetic complications: no    Last Vitals:  Vitals:   07/31/15 1351 07/31/15 1400  BP: (!) 153/99 (!) 152/94  Pulse: 76 79  Resp: 16   Temp: 37.2 C     Last Pain:  Vitals:   07/31/15 1351  TempSrc: Tympanic  PainSc: 4                  Masoud Nyce

## 2015-07-31 NOTE — Anesthesia Procedure Notes (Signed)
Procedure Name: LMA Insertion Date/Time: 07/31/2015 12:25 PM Performed by: Henrietta Hoover Pre-anesthesia Checklist: Patient identified, Emergency Drugs available, Suction available, Patient being monitored and Timeout performed Patient Re-evaluated:Patient Re-evaluated prior to inductionOxygen Delivery Method: Circle system utilized Preoxygenation: Pre-oxygenation with 100% oxygen Intubation Type: IV induction Ventilation: Mask ventilation without difficulty LMA: LMA inserted LMA Size: 4.0 Number of attempts: 1 Tube secured with: Tape Dental Injury: Teeth and Oropharynx as per pre-operative assessment

## 2015-07-31 NOTE — Progress Notes (Signed)
Pt feeling better and wants to go home

## 2015-07-31 NOTE — H&P (View-Only) (Signed)
07/25/2015 12:28 PM   Susan BarriosMichelle F Sawyers 1975-03-11 [id number]  Referring provider: Domenic Schwabheryl Paulette Lindley, FNP [address] [place], [place] [id number]  Chief Complaint  Patient presents with  . Nephrolithiasis    HPI: The patient is a 40 year old female presents today to discuss her left ureteral colic this. His 4 mm in distal left ureter. Her pain is currently well controlled. She denies fevers. She has passed multiple stones in the past since the age of 89. She has had lithotripsy and ureteroscopy in the past.    PMH: Past Medical History  Diagnosis Date  . Interstitial cystitis   . Endometriosis   . LBP (low back pain) 05/23/2013  . Ache in joint 02/04/2013  . Addiction, opium (HCC) 01/28/2012  . Hypertension     H/O-BEEN AT LEAST 5 YEARS  . Hypothyroidism   . Kidney stones     H/O CHRONIC KIDNEY STONE  . Complication of anesthesia   . PONV (postoperative nausea and vomiting)     Surgical History: Past Surgical History  Procedure Laterality Date  . Appendectomy    . Abdominal hysterectomy    . Cesarean section      X2  . Carpal tunnel    . Breast enhancement surgery    . Diagnostic laparoscopy    . Carpal tunnel release Left 01/28/2015    Procedure: CARPAL TUNNEL RELEASE;  Surgeon: Deeann SaintHoward Miller, MD;  Location: [facility] ORS;  Service: Orthopedics;  Laterality: Left;    Home Medications:    Medication List       This list is accurate as of: 07/25/15 12:28 PM.  Always use your most recent med list.               busPIRone 5 MG tablet  Commonly known as:  BUSPAR  Take 5 mg by mouth 3 (three) times daily.     cloNIDine 0.1 MG tablet  Commonly known as:  CATAPRES  Take 0.1 mg by mouth 3 (three) times daily.     naloxone HCl 4 MG/0.1ML Liqd  Commonly known as:  NARCAN  Place 1 spray into the nose once. Spray half of bottle content into each nostril, then call 911     ondansetron 8 MG disintegrating tablet  Commonly known as:  ZOFRAN ODT  Take 1 tablet (8 mg  total) by mouth every 8 (eight) hours as needed for nausea.     ondansetron 4 MG disintegrating tablet  Commonly known as:  ZOFRAN ODT  Take 1 tablet (4 mg total) by mouth every 8 (eight) hours as needed for nausea or vomiting.     oxyCODONE-acetaminophen 5-325 MG tablet  Commonly known as:  PERCOCET/ROXICET  Take 1 tablet by mouth every 4 (four) hours as needed for severe pain.     oxyCODONE-acetaminophen 5-325 MG tablet  Commonly known as:  ROXICET  Take 1-2 tablets by mouth every 4 (four) hours as needed for severe pain.     QUEtiapine 50 MG tablet  Commonly known as:  SEROQUEL  Take 50 mg by mouth at bedtime.     QUEtiapine 100 MG tablet  Commonly known as:  SEROQUEL  Reported on 07/25/2015     tamsulosin 0.4 MG Caps capsule  Commonly known as:  FLOMAX  Take 1 capsule (0.4 mg total) by mouth daily after breakfast.     tamsulosin 0.4 MG Caps capsule  Commonly known as:  FLOMAX  Take 1 capsule (0.4 mg total) by mouth daily.  Allergies:  Allergies  Allergen Reactions  . Codeine     Other reaction(s): Other (See Comments) Other Reaction: OTHER REACTION - ITCHING, SWEL  . Butorphanol Tartrate Itching and Other (See Comments)    Hallucinations  . Promethazine     Other reaction(s): Other (See Comments) Other Reaction: ARM SPASMS Other reaction(s): Other (See Comments) Arm spasms  . Sumatriptan Succinate     Other reaction(s): Other (See Comments) Other Reaction: BURNING OF SKIN  . Topamax [Topiramate]     Muscles spams  . Toradol [Ketorolac Tromethamine] Other (See Comments)    Other reaction(s): Other (See Comments) Burns face Muscle spasms  . Tramadol     Other reaction(s): Other (See Comments) Other Reaction: IRRITABILITY AND INSOMNIA MUSCLE SPASMS IN ARMS  . Sulfa Antibiotics Rash    Family History: No family history on file.  Social History:  reports that she has never smoked. She has never used smokeless tobacco. She reports that she does not  drink alcohol or use illicit drugs.  ROS:                                        Physical Exam: BP 121/83 mmHg  Pulse 90  Ht 5' (1.524 m)  Wt 124 lb 12.8 oz (56.609 kg)  BMI 24.37 kg/m2  Constitutional:  Alert and oriented, No acute distress. HEENT: [place] AT, moist mucus membranes.  Trachea midline, no masses. Cardiovascular: No clubbing, cyanosis, or edema. Respiratory: Normal respiratory effort, no increased work of breathing. GI: Abdomen is soft, nontender, nondistended, no abdominal masses GU: No CVA tenderness.  Skin: No rashes, bruises or suspicious lesions. Lymph: No cervical or inguinal adenopathy. Neurologic: Grossly intact, no focal deficits, moving all 4 extremities. Psychiatric: Normal mood and affect.  Laboratory Data: Lab Results  Component Value Date   WBC 10.6 07/21/2015   HGB 14.0 07/21/2015   HCT 40.3 07/21/2015   MCV 82.9 07/21/2015   PLT 256 07/21/2015    Lab Results  Component Value Date   CREATININE 0.80 07/21/2015    No results found for: PSA  No results found for: TESTOSTERONE  No results found for: HGBA1C  Urinalysis    Component Value Date/Time   COLORURINE STRAW* 07/21/2015 2050   COLORURINE Yellow 01/02/2014 1040   APPEARANCEUR CLEAR* 07/21/2015 2050   APPEARANCEUR Hazy 01/02/2014 1040   LABSPEC 1.004* 07/21/2015 2050   LABSPEC 1.012 01/02/2014 1040   PHURINE 6.0 07/21/2015 2050   PHURINE 6.0 01/02/2014 1040   GLUCOSEU NEGATIVE 07/21/2015 2050   GLUCOSEU Negative 01/02/2014 1040   HGBUR 1+* 07/21/2015 2050   HGBUR 2+ 01/02/2014 1040   BILIRUBINUR NEGATIVE 07/21/2015 2050   BILIRUBINUR Negative 01/02/2014 1040   KETONESUR NEGATIVE 07/21/2015 2050   KETONESUR Negative 01/02/2014 1040   PROTEINUR NEGATIVE 07/21/2015 2050   PROTEINUR Negative 01/02/2014 1040   NITRITE NEGATIVE 07/21/2015 2050   NITRITE Negative 01/02/2014 1040   LEUKOCYTESUR NEGATIVE 07/21/2015 2050   LEUKOCYTESUR Trace 01/02/2014 1040     Pertinent Imaging: The patient has a 4 mm distal left ureteral calculus in review of her recent CT scan.   Assessment & Plan:   1. Distal left ureteral stone The patient would like to continue medical expulsive therapy at this time. This is a reasonable decision. I refilled her medications for Percocet and Flomax. She is also given a strainer. We will see her back in 2 weeks for  KUB prior. He has been instructed to contact the office if she has unexplained fevers or pain becomes uncontrolled.  Return in about 2 weeks (around 08/08/2015) for KUB prior.  Hildred Laser, MD  [facility] [address], [address] [place], [place] [address] [phone]

## 2015-07-31 NOTE — Interval H&P Note (Signed)
History and Physical Interval Note:  07/31/2015 10:41 AM  [name]  has presented today for surgery, with the diagnosis of left ureteral stone  The various methods of treatment have been discussed with the patient and family. After consideration of risks, benefits and other options for treatment, the patient has consented to  Procedure(s): URETEROSCOPY WITH HOLMIUM LASER LITHOTRIPSY (Left) CYSTOSCOPY WITH STENT PLACEMENT (Left) as a surgical intervention .  The patient's history has been reviewed, patient examined, no change in status, stable for surgery.  I have reviewed the patient's chart and labs.  Questions were answered to the patient's satisfaction.    Patient has failed medical expulsive therapy. She wishes to undergo definitive surgical management. We discussed cystoscopy, left ureteroscopy, laser lithotripsy, left renal stent placement. She understands the goal will be to remove the stone in one procedure. She also understands it may take more than 1. Insertion of the risks, benefits, indications of this procedure. She understands the risks include but are not limited to bleeding, infection, iatrogenic injury. She understands she'll start of the procedure which may or may not have a tether. All questions were answered. The patient wants to proceed.  RRR Unlabored resp  Hildred Laser

## 2015-08-01 ENCOUNTER — Telehealth: Payer: Self-pay | Admitting: Urology

## 2015-08-01 ENCOUNTER — Encounter: Payer: Self-pay | Admitting: Urology

## 2015-08-01 NOTE — Telephone Encounter (Signed)
-----   Message from Skeet Latch, LPN sent at [phone]  1:32 PM EDT -----   ----- Message ----- From: Hildred Laser, MD Sent: 07/31/2015  12:57 PM To: Skeet Latch, LPN  Patient needs to see me next week for cysto stent removal. Ok to double book just before lunch if need be

## 2015-08-08 ENCOUNTER — Telehealth: Payer: Self-pay | Admitting: Urology

## 2015-08-08 ENCOUNTER — Telehealth: Payer: Self-pay

## 2015-08-08 NOTE — Telephone Encounter (Signed)
Pt called complaining of left side kidney pain w/ gross hematuria. She currently got an stent placed on 07/31/15 for an left ureteral stone.  I spoke w/ Dr. Sherryl Barters and he informed me to put the pt on the schedule for tomorrow @ 1:30pm for her stent to be removed.

## 2015-08-09 ENCOUNTER — Encounter: Payer: Self-pay | Admitting: Urology

## 2015-08-09 ENCOUNTER — Ambulatory Visit (INDEPENDENT_AMBULATORY_CARE_PROVIDER_SITE_OTHER): Payer: BLUE CROSS/BLUE SHIELD | Admitting: Urology

## 2015-08-09 VITALS — BP 123/81 | HR 96 | Ht 60.0 in | Wt 128.6 lb

## 2015-08-09 DIAGNOSIS — N200 Calculus of kidney: Secondary | ICD-10-CM | POA: Diagnosis not present

## 2015-08-09 LAB — MICROSCOPIC EXAMINATION
BACTERIA UA: NONE SEEN
RBC, UA: >30 /hpf — AB (ref 0–?)
WBC UA: NONE SEEN /HPF (ref 0–?)

## 2015-08-09 LAB — URINALYSIS, COMPLETE
Bilirubin, UA: NEGATIVE
GLUCOSE, UA: NEGATIVE
Ketones, UA: NEGATIVE
Nitrite, UA: NEGATIVE
PH UA: 7.5 (ref 5.0–7.5)
Specific Gravity, UA: 1.015 (ref 1.005–1.030)
Urobilinogen, Ur: 0.2 mg/dL (ref 0.2–1.0)

## 2015-08-09 MED ORDER — LIDOCAINE HCL 2 % EX GEL: 1.0000 "application " | Freq: Once | CUTANEOUS | Status: DC

## 2015-08-09 MED ORDER — CIPROFLOXACIN HCL 500 MG PO TABS
500.0000 mg | ORAL_TABLET | Freq: Once | ORAL | Status: AC
Start: 2015-08-09 — End: 2015-08-09
  Administered 2015-08-09: 500 mg via ORAL

## 2015-08-09 NOTE — Progress Notes (Signed)
08/09/2015 1:56 PM   Ermalinda Barrios 02-09-1975 [id number]  Referring provider: Domenic Schwab, FNP [address] [place], [place] [address]  Chief Complaint  Patient presents with  . Cysto Stent Removal    HPI: The patient is a 40 year old female who recently underwent left ureteroscopy and removal of distal ureteral catheters. She presents today for stone removal. She has noted some hematuria and has been bothered by the stent. She has otherwise done well.   PMH: Past Medical History:  Diagnosis Date  . Ache in joint 02/04/2013  . Addiction, opium (HCC) 01/28/2012  . Anemia    H/O  . Complication of anesthesia   . Endometriosis   . Family history of adverse reaction to anesthesia    DAD-N/V, HEADACHES  . Heart murmur   . History of methicillin resistant staphylococcus aureus (MRSA) 2012  . Hypertension    H/O-BEEN AT LEAST 5 YEARS  . Hypothyroidism   . Interstitial cystitis   . Kidney stones    H/O CHRONIC KIDNEY STONE  . LBP (low back pain) 05/23/2013  . PONV (postoperative nausea and vomiting)   . TIA (transient ischemic attack) 2007    Surgical History: Past Surgical History:  Procedure Laterality Date  . ABDOMINAL HYSTERECTOMY    . APPENDECTOMY    . BREAST ENHANCEMENT SURGERY    . carpal tunnel    . CARPAL TUNNEL RELEASE Left 01/28/2015   Procedure: CARPAL TUNNEL RELEASE;  Surgeon: Deeann Saint, MD;  Location: [facility] ORS;  Service: Orthopedics;  Laterality: Left;  . CESAREAN SECTION     X2  . CYSTOSCOPY WITH STENT PLACEMENT Left 07/31/2015   Procedure: CYSTOSCOPY WITH STENT PLACEMENT;  Surgeon: Hildred Laser, MD;  Location: [facility] ORS;  Service: Urology;  Laterality: Left;  . DIAGNOSTIC LAPAROSCOPY    . LITHOTRIPSY    . URETEROSCOPY WITH HOLMIUM LASER LITHOTRIPSY Left 07/31/2015   Procedure: URETEROSCOPY WITH HOLMIUM LASER LITHOTRIPSY;  Surgeon: Hildred Laser, MD;  Location: [facility] ORS;  Service: Urology;  Laterality: Left;    Home Medications:     Medication List       Accurate as of 08/09/15  1:56 PM. Always use your most recent med list.          busPIRone 5 MG tablet Commonly known as:  BUSPAR Take 5 mg by mouth 3 (three) times daily.   cephALEXin 500 MG capsule Commonly known as:  KEFLEX Take 1 capsule (500 mg total) by mouth 3 (three) times daily.   cloNIDine 0.1 MG tablet Commonly known as:  CATAPRES Take 0.1 mg by mouth 3 (three) times daily.   ondansetron 4 MG disintegrating tablet Commonly known as:  ZOFRAN ODT Take 1 tablet (4 mg total) by mouth every 8 (eight) hours as needed for nausea or vomiting.   oxyCODONE-acetaminophen 5-325 MG tablet Commonly known as:  ROXICET Take 1-2 tablets by mouth every 4 (four) hours as needed for severe pain.   QUEtiapine 50 MG tablet Commonly known as:  SEROQUEL Take 50 mg by mouth at bedtime.   tamsulosin 0.4 MG Caps capsule Commonly known as:  FLOMAX Take 1 capsule (0.4 mg total) by mouth daily.       Allergies:  Allergies  Allergen Reactions  . Codeine     Other reaction(s): Other (See Comments) Other Reaction: OTHER REACTION - ITCHING, SWEL  . Butorphanol Tartrate Itching and Other (See Comments)    Other reaction(s): Other (See Comments) hallucinations Hallucinations  . Ketorolac  Other reaction(s): Other (See Comments) Burns face  . Promethazine     Other reaction(s): Other (See Comments) Arm spasms Other reaction(s): Other (See Comments) Other Reaction: ARM SPASMS Other reaction(s): Other (See Comments) Arm spasms  . Sumatriptan Succinate     Other reaction(s): Other (See Comments) Other Reaction: BURNING OF SKIN  . Topamax [Topiramate]     Muscles spams  . Toradol [Ketorolac Tromethamine] Other (See Comments)    Other reaction(s): Other (See Comments) Burns face Muscle spasms  . Tramadol     Other reaction(s): Other (See Comments) Other Reaction: IRRITABILITY AND INSOMNIA MUSCLE SPASMS IN ARMS  . Sulfa Antibiotics Rash    Family  History: No family history on file.  Social History:  reports that she has never smoked. She has never used smokeless tobacco. She reports that she does not drink alcohol or use drugs.  ROS:                                        Physical Exam: BP 123/81 (BP Location: Left Arm, Patient Position: Sitting, Cuff Size: Normal)   Pulse 96   Ht 5' (1.524 m)   Wt 128 lb 9.6 oz (58.3 kg)   BMI 25.12 kg/m   Constitutional:  Alert and oriented, No acute distress. HEENT: [place] AT, moist mucus membranes.  Trachea midline, no masses. Cardiovascular: No clubbing, cyanosis, or edema. Respiratory: Normal respiratory effort, no increased work of breathing. GI: Abdomen is soft, nontender, nondistended, no abdominal masses GU: No CVA tenderness.  Skin: No rashes, bruises or suspicious lesions. Lymph: No cervical or inguinal adenopathy. Neurologic: Grossly intact, no focal deficits, moving all 4 extremities. Psychiatric: Normal mood and affect.  Laboratory Data: Lab Results  Component Value Date   WBC 10.6 07/21/2015   HGB 14.0 07/21/2015   HCT 40.3 07/21/2015   MCV 82.9 07/21/2015   PLT 256 07/21/2015    Lab Results  Component Value Date   CREATININE 0.80 07/21/2015    No results found for: PSA  No results found for: TESTOSTERONE  No results found for: HGBA1C  Urinalysis    Component Value Date/Time   COLORURINE STRAW (A) 07/21/2015 2050   APPEARANCEUR Cloudy (A) 07/25/2015 1221   LABSPEC 1.004 (L) 07/21/2015 2050   LABSPEC 1.012 01/02/2014 1040   PHURINE 6.0 07/21/2015 2050   GLUCOSEU Negative 07/25/2015 1221   GLUCOSEU Negative 01/02/2014 1040   HGBUR 1+ (A) 07/21/2015 2050   BILIRUBINUR Negative 07/25/2015 1221   BILIRUBINUR Negative 01/02/2014 1040   KETONESUR NEGATIVE 07/21/2015 2050   PROTEINUR Negative 07/25/2015 1221   PROTEINUR NEGATIVE 07/21/2015 2050   NITRITE Negative 07/25/2015 1221   NITRITE NEGATIVE 07/21/2015 2050   LEUKOCYTESUR Trace  (A) 07/25/2015 1221   LEUKOCYTESUR Trace 01/02/2014 1040     Cystoscopy Procedure Note  Patient identification was confirmed, informed consent was obtained, and patient was prepped using Betadine solution.  Lidocaine jelly was administered per urethral meatus.    Preoperative abx where received prior to procedure.    Procedure: - Flexible cystoscope introduced, without any difficulty.   - Left ureteral stent was grasped flexible graspers and removed per urethral meatus.  Post-Procedure: - Patient tolerated the procedure well   Assessment & Plan:    1. Nephrolithiasis -Follow up in 1 month with renal ultrasound Road iatrogenic hydronephrosis -Will discuss stone analysis results that time   Return for with renal u/s  prior.  Nickie Retort, MD  [facility] [address], [place], [place] [address] [phone]

## 2015-08-11 LAB — STONE ANALYSIS
CA OXALATE, DIHYDRATE: 15 %
CA OXALATE, MONOHYDR.: 60 %
Ca phos cry stone ql IR: 25 %
Stone Weight KSTONE: 10 mg

## 2015-08-12 ENCOUNTER — Telehealth: Payer: Self-pay | Admitting: Urology

## 2015-08-12 ENCOUNTER — Ambulatory Visit: Payer: BLUE CROSS/BLUE SHIELD

## 2015-08-12 DIAGNOSIS — N200 Calculus of kidney: Secondary | ICD-10-CM

## 2015-08-12 NOTE — Telephone Encounter (Signed)
Patient called at 4:30 on 08-12-15 complaining of left side pain in her kidney,cramping with nausea. She stated that she is out of pain meds and wants to know what she should do?  Thanks, Laurali

## 2015-08-13 ENCOUNTER — Encounter: Payer: Self-pay | Admitting: Emergency Medicine

## 2015-08-13 ENCOUNTER — Emergency Department
Admission: EM | Admit: 2015-08-13 | Discharge: 2015-08-13 | Disposition: A | Discharge status: Home / Self Care | Payer: BLUE CROSS/BLUE SHIELD | Attending: Emergency Medicine | Admitting: Emergency Medicine

## 2015-08-13 ENCOUNTER — Other Ambulatory Visit: Payer: Self-pay

## 2015-08-13 ENCOUNTER — Emergency Department: Payer: BLUE CROSS/BLUE SHIELD

## 2015-08-13 DIAGNOSIS — R10814 Left lower quadrant abdominal tenderness: Secondary | ICD-10-CM | POA: Insufficient documentation

## 2015-08-13 DIAGNOSIS — R109 Unspecified abdominal pain: Secondary | ICD-10-CM | POA: Diagnosis present

## 2015-08-13 DIAGNOSIS — E039 Hypothyroidism, unspecified: Secondary | ICD-10-CM | POA: Diagnosis not present

## 2015-08-13 DIAGNOSIS — I10 Essential (primary) hypertension: Secondary | ICD-10-CM | POA: Diagnosis not present

## 2015-08-13 DIAGNOSIS — Z79899 Other long term (current) drug therapy: Secondary | ICD-10-CM | POA: Insufficient documentation

## 2015-08-13 DIAGNOSIS — Z960 Presence of urogenital implants: Secondary | ICD-10-CM | POA: Diagnosis not present

## 2015-08-13 LAB — COMPREHENSIVE METABOLIC PANEL
ALT: 21 U/L (ref 14–54)
AST: 31 U/L (ref 15–41)
Albumin: 4.4 g/dL (ref 3.5–5.0)
Alkaline Phosphatase: 96 U/L (ref 38–126)
Anion gap: 13 (ref 5–15)
BILIRUBIN TOTAL: 0.8 mg/dL (ref 0.3–1.2)
BUN: 21 mg/dL — AB (ref 6–20)
CHLORIDE: 100 mmol/L — AB (ref 101–111)
CO2: 23 mmol/L (ref 22–32)
CREATININE: 0.99 mg/dL (ref 0.44–1.00)
Calcium: 9.5 mg/dL (ref 8.9–10.3)
Glucose, Bld: 155 mg/dL — ABNORMAL HIGH (ref 65–99)
POTASSIUM: 4.1 mmol/L (ref 3.5–5.1)
Sodium: 136 mmol/L (ref 135–145)
TOTAL PROTEIN: 7.7 g/dL (ref 6.5–8.1)

## 2015-08-13 LAB — URINALYSIS COMPLETE WITH MICROSCOPIC (ARMC ONLY)
BILIRUBIN URINE: NEGATIVE
Bacteria, UA: NONE SEEN
Glucose, UA: NEGATIVE mg/dL
NITRITE: NEGATIVE
Protein, ur: 100 mg/dL — AB
SPECIFIC GRAVITY, URINE: 1.023 (ref 1.005–1.030)
pH: 6 (ref 5.0–8.0)

## 2015-08-13 LAB — CBC
HEMATOCRIT: 42.5 % (ref 35.0–47.0)
HEMOGLOBIN: 14.6 g/dL (ref 12.0–16.0)
MCH: 28.8 pg (ref 26.0–34.0)
MCHC: 34.3 g/dL (ref 32.0–36.0)
MCV: 84 fL (ref 80.0–100.0)
Platelets: 289 10*3/uL (ref 150–440)
RBC: 5.06 MIL/uL (ref 3.80–5.20)
RDW: 14.5 % (ref 11.5–14.5)
WBC: 9.1 10*3/uL (ref 3.6–11.0)

## 2015-08-13 NOTE — ED Triage Notes (Signed)
Pt presents to ED c/o left sided flank pain with nausea since Friday. Pt had ureter stent removed Friday afternoon. Pain worsens with movement and has not been much better. 7/10

## 2015-08-13 NOTE — Telephone Encounter (Signed)
Spoke with pt in reference to pain post stent removal. Made pt aware can come by for a urine drop off and RUS. Pt voiced understanding. Pt requested pain medication. Reinforced with pt to take tylenol and ibuprofen alternating every 4hrs. Pt voiced frustration. Reinforced with pt the need of RUS. Pt voiced understanding.

## 2015-08-13 NOTE — ED Provider Notes (Addendum)
[facility] Emergency Department Provider Note  Time seen: 5:26 PM  I have reviewed the triage vital signs and the nursing notes.   HISTORY  Chief Complaint Flank Pain and Nausea    HPI [name] is a 40 y.o. female with a past medical history kidney stones, status post ureteral stent removal 4 days ago who presents the emergency department for left flank pain. According to the patient since her ureteral stent was removed 4 days ago she has had progressively worsening left flank pain which she describes as moderate to severe. States hematuria 4 days ago but that has resolved. States nausea and vomiting 4 days ago but that has resolved. Denies fever. Denies diarrhea. Patient states she called her urologist but they were not able to see her so she came to the emergency department for evaluation. After pain as sharp stabbing pain 10/10 on the left flank.  Past Medical History:  Diagnosis Date  . Ache in joint 02/04/2013  . Addiction, opium (HCC) 01/28/2012  . Anemia    H/O  . Complication of anesthesia   . Endometriosis   . Family history of adverse reaction to anesthesia    DAD-N/V, HEADACHES  . Heart murmur   . History of methicillin resistant staphylococcus aureus (MRSA) 2012  . Hypertension    H/O-BEEN AT LEAST 5 YEARS  . Hypothyroidism   . Interstitial cystitis   . Kidney stones    H/O CHRONIC KIDNEY STONE  . LBP (low back pain) 05/23/2013  . PONV (postoperative nausea and vomiting)   . TIA (transient ischemic attack) 2007    Patient Active Problem List   Diagnosis Date Noted  . Chronic abdominal pain (Location of Primary Source of Pain) (RLQ: right lower quadrant) (Right) 03/25/2015  . Chronic flank pain (Location of Secondary source of pain) (Right) 03/25/2015  . Chronic groin pain (Location of Tertiary source of pain) (Right) 03/01/2015  . Chronic pain 11/27/2014  . Long term current use of opiate analgesic 11/27/2014  . Long term  prescription opiate use 11/27/2014  . Opiate use (380 MME/Day) 11/27/2014  . Encounter for therapeutic drug level monitoring 11/27/2014  . Encounter for chronic pain management 11/27/2014  . Chronic low back pain (Location of Primary Source of Pain) (Bilateral) (R>L) 11/27/2014  . Osteoarthrosis 11/27/2014  . 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 Methadone use (HCC) 11/27/2014  . History of methadone use (HCC) 11/27/2014  . Pain due to interstitial cystitis 11/27/2014  . Chronic interstitial cystitis 11/27/2014  . Chronic pelvic pain (Location of Secondary source of pain) (suprapubic) (Bilateral) (R>L) 11/27/2014  . Chronic hip pain (Right) 11/27/2014  . Obesity, Class I, BMI 30-34.9 (68% higher incidence of chronic low back pain) 11/27/2014  . History of migraine 11/27/2014  . Encounter for long-term (current) use of other high-risk medications 11/27/2014  . 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 Central chest pain 08/17/2013  . Cannot sleep 02/04/2013  . Ache in joint 02/04/2013  . Affective disorder (HCC) 06/16/2012  . Episodic mood disorder (HCC) 06/16/2012  . Flank pain 03/15/2012  . Anxiety state 01/28/2012  . Opioid dependence (HCC) 01/28/2012  . Continuous opioid dependence (HCC) 03/31/2011  . Calculus of kidney 03/31/2011    Past Surgical History:  Procedure Laterality Date  . ABDOMINAL HYSTERECTOMY    . APPENDECTOMY    . BREAST ENHANCEMENT SURGERY    . carpal tunnel    . CARPAL TUNNEL RELEASE Left 01/28/2015  Procedure: CARPAL TUNNEL RELEASE;  Surgeon: Deeann Saint, MD;  Location: [facility] ORS;  Service: Orthopedics;  Laterality: Left;  . CESAREAN SECTION     X2  . CYSTOSCOPY WITH STENT PLACEMENT Left 07/31/2015   Procedure: CYSTOSCOPY WITH STENT PLACEMENT;  Surgeon: Hildred Laser, MD;  Location: [facility] ORS;  Service: Urology;  Laterality: Left;  . DIAGNOSTIC LAPAROSCOPY    . LITHOTRIPSY    . URETEROSCOPY WITH  HOLMIUM LASER LITHOTRIPSY Left 07/31/2015   Procedure: URETEROSCOPY WITH HOLMIUM LASER LITHOTRIPSY;  Surgeon: Hildred Laser, MD;  Location: [facility] ORS;  Service: Urology;  Laterality: Left;    Prior to Admission medications   Medication Sig Start Date End Date Taking? Authorizing Provider  busPIRone (BUSPAR) 5 MG tablet Take 5 mg by mouth 3 (three) times daily.    Historical Provider, MD  cephALEXin (KEFLEX) 500 MG capsule Take 1 capsule (500 mg total) by mouth 3 (three) times daily. Patient not taking: Reported on 08/09/2015 07/31/15   Hildred Laser, MD  cloNIDine (CATAPRES) 0.1 MG tablet Take 0.1 mg by mouth 3 (three) times daily.    Historical Provider, MD  ondansetron (ZOFRAN ODT) 4 MG disintegrating tablet Take 1 tablet (4 mg total) by mouth every 8 (eight) hours as needed for nausea or vomiting. 07/31/15   Hildred Laser, MD  oxyCODONE-acetaminophen (ROXICET) 5-325 MG tablet Take 1-2 tablets by mouth every 4 (four) hours as needed for severe pain. 07/31/15   Hildred Laser, MD  QUEtiapine (SEROQUEL) 50 MG tablet Take 50 mg by mouth at bedtime.    Historical Provider, MD  tamsulosin (FLOMAX) 0.4 MG CAPS capsule Take 1 capsule (0.4 mg total) by mouth daily. 07/25/15   Hildred Laser, MD    Allergies  Allergen Reactions  . Codeine     Other reaction(s): Other (See Comments) Other Reaction: OTHER REACTION - ITCHING, SWEL  . Butorphanol Tartrate Itching and Other (See Comments)    Other reaction(s): Other (See Comments) hallucinations Hallucinations  . Ketorolac     Other reaction(s): Other (See Comments) Burns face  . Promethazine     Other reaction(s): Other (See Comments) Arm spasms Other reaction(s): Other (See Comments) Other Reaction: ARM SPASMS Other reaction(s): Other (See Comments) Arm spasms  . Sumatriptan Succinate     Other reaction(s): Other (See Comments) Other Reaction: BURNING OF SKIN  . Topamax [Topiramate]     Muscles spams  . Toradol [Ketorolac  Tromethamine] Other (See Comments)    Other reaction(s): Other (See Comments) Burns face Muscle spasms  . Tramadol     Other reaction(s): Other (See Comments) Other Reaction: IRRITABILITY AND INSOMNIA MUSCLE SPASMS IN ARMS  . Sulfa Antibiotics Rash    History reviewed. No pertinent family history.  Social History Social History  Substance Use Topics  . Smoking status: Never Smoker  . Smokeless tobacco: Never Used  . Alcohol use No    Review of Systems Constitutional: Negative for fever. Cardiovascular: Negative for chest pain. Respiratory: Negative for shortness of breath. Gastrointestinal: Left flank pain. Nausea and vomiting now resolved. Negative for diarrhea. Genitourinary: Negative for dysuria. Hematuria now resolved. Musculoskeletal: Left back pain. Skin: Negative for rash. Neurological: Negative for headaches, focal weakness or numbness. 10-point ROS otherwise negative.  ____________________________________________   PHYSICAL EXAM:  VITAL SIGNS: ED Triage Vitals  Enc Vitals Group     BP 08/13/15 1654 (!) 84/64     Pulse Rate 08/13/15 1654 (!) 102     Resp 08/13/15 1654 18  Temp 08/13/15 1654 98.6 F (37 C)     Temp Source 08/13/15 1654 Oral     SpO2 08/13/15 1654 100 %     Weight 08/13/15 1654 125 lb (56.7 kg)     Height 08/13/15 1654 5' (1.524 m)     Head Circumference --      Peak Flow --      Pain Score 08/13/15 1708 7     Pain Loc --      Pain Edu? --      Excl. in GC? --     Constitutional: Alert and oriented. Well appearing and in no distress. Eyes: Normal exam ENT   Head: Normocephalic and atraumatic.   Mouth/Throat: Mucous membranes are moist. Cardiovascular: Normal rate, regular rhythm. No murmur Respiratory: Normal respiratory effort without tachypnea nor retractions. Breath sounds are clear  Gastrointestinal: Soft, moderate left mid to left lower quadrant tenderness palpation. No rebound or guarding. No  distention. Musculoskeletal: Nontender with normal range of motion in all extremities Neurologic:  Normal speech and language. No gross focal neurologic deficits Skin:  Skin is warm, dry and intact.  Psychiatric: Mood and affect are normal. Speech and behavior are normal.   ____________________________________________     RADIOLOGY  CT shows no acute abnormality. No recurrent or residual stone.  ____________________________________________   INITIAL IMPRESSION / ASSESSMENT AND PLAN / ED COURSE  Pertinent labs & imaging results that were available during my care of the patient were reviewed by me and considered in my medical decision making (see chart for details).  The patient presents emergency department left flank pain for the past 4 days which she states has progressively worsened. Now 10/10 sharp stabbing pain since having her ureteral stent removed 4 days ago. After reviewing the patient's substance reporting system record she is prescribed 10 mg oxycodone tablets chronically the last of which was filled 08/07/15 120 10 mg tablets. 07/31/15 she had 35 mg Percocet tablets filled, and 07/27/15 she had 120 10 mg methadone tablets filled. When I discussed this with the patient she initially states that she is out of her pain medication, then she stated that her pain doctor had started to wean her off pain medication which is why she is out of pain medicine. When I told her that she had 120 tablets filled on the second she states that she did get them filled but then returned them to the pharmacy because she was worried that she was taking too much medication. I called the pharmacy and they state the patient picked up the prescription 08/07/15 and no one has returned any medication. Given the patient's chronic opiate use and I discussed with the patient that we will not be using narcotics in the emergency department. We'll continue her medical workup with labs, urinalysis and a CT scan of the left  flank to further evaluate.  CT shows no acute abnormality. Labs show red blood cells, but otherwise appeared to be normal. We will send a urine culture. I discussed with the patient that unfortunately we are not able to provide narcotic medications and she will need to discuss this with her urologist.   EKG reviewed and interpreted by myself shows sinus tachycardia 124 bpm, narrow QRS, normal axis, normal intervals, nonspecific ST changes. No ST elevation.  ____________________________________________   FINAL CLINICAL IMPRESSION(S) / ED DIAGNOSES  Left flank pain    Minna Antis, MD 08/13/15 1610    Minna Antis, MD 08/13/15 [phone]

## 2015-08-13 NOTE — ED Notes (Signed)
Patient transported to CT 

## 2015-08-13 NOTE — Telephone Encounter (Signed)
Pt called again this morning stating that she has been in the bed since Friday due to the kidney pain. Pt described to be 7/10. Pt stated that her urine is clear and has been apply heating pads to the area without relief. Please advise.

## 2015-08-13 NOTE — Telephone Encounter (Signed)
She shouldn't still have pain at this point from stent removal. I would get a urine culture and renal u/s to r/o hydronephrosis and infection

## 2015-08-14 ENCOUNTER — Other Ambulatory Visit: Payer: BLUE CROSS/BLUE SHIELD

## 2015-08-14 LAB — URINE CULTURE: Culture: NO GROWTH

## 2015-08-15 ENCOUNTER — Telehealth: Payer: Self-pay

## 2015-08-15 NOTE — Telephone Encounter (Signed)
Pt called stating she was having flank pain after stent removal. She went to the hospital on Tuesday, August 8th and had a CT scan done and UA, both normal. She requested for some pain medication because she was about to run out. I informed the pt since the ER ruled out it was not Urological she needs to f/u with PCP. The pt verbal stated understanding.

## 2015-08-20 ENCOUNTER — Emergency Department: Payer: BLUE CROSS/BLUE SHIELD

## 2015-08-20 ENCOUNTER — Observation Stay
Admission: EM | Admit: 2015-08-20 | Discharge: 2015-08-21 | Disposition: A | Discharge status: Home / Self Care | Payer: BLUE CROSS/BLUE SHIELD | Attending: Internal Medicine | Admitting: Internal Medicine

## 2015-08-20 ENCOUNTER — Telehealth: Payer: Self-pay

## 2015-08-20 ENCOUNTER — Encounter: Payer: Self-pay | Admitting: Emergency Medicine

## 2015-08-20 DIAGNOSIS — R1011 Right upper quadrant pain: Secondary | ICD-10-CM | POA: Diagnosis present

## 2015-08-20 DIAGNOSIS — Z8673 Personal history of transient ischemic attack (TIA), and cerebral infarction without residual deficits: Secondary | ICD-10-CM | POA: Insufficient documentation

## 2015-08-20 DIAGNOSIS — E039 Hypothyroidism, unspecified: Secondary | ICD-10-CM | POA: Diagnosis not present

## 2015-08-20 DIAGNOSIS — Z885 Allergy status to narcotic agent status: Secondary | ICD-10-CM | POA: Diagnosis not present

## 2015-08-20 DIAGNOSIS — Z9889 Other specified postprocedural states: Secondary | ICD-10-CM | POA: Diagnosis not present

## 2015-08-20 DIAGNOSIS — Z9882 Breast implant status: Secondary | ICD-10-CM | POA: Diagnosis not present

## 2015-08-20 DIAGNOSIS — G8929 Other chronic pain: Secondary | ICD-10-CM | POA: Diagnosis not present

## 2015-08-20 DIAGNOSIS — Z9109 Other allergy status, other than to drugs and biological substances: Secondary | ICD-10-CM | POA: Insufficient documentation

## 2015-08-20 DIAGNOSIS — F332 Major depressive disorder, recurrent severe without psychotic features: Secondary | ICD-10-CM | POA: Insufficient documentation

## 2015-08-20 DIAGNOSIS — Z87898 Personal history of other specified conditions: Secondary | ICD-10-CM | POA: Insufficient documentation

## 2015-08-20 DIAGNOSIS — Z79899 Other long term (current) drug therapy: Secondary | ICD-10-CM | POA: Diagnosis not present

## 2015-08-20 DIAGNOSIS — Z87442 Personal history of urinary calculi: Secondary | ICD-10-CM | POA: Diagnosis not present

## 2015-08-20 DIAGNOSIS — Z90710 Acquired absence of both cervix and uterus: Secondary | ICD-10-CM | POA: Diagnosis not present

## 2015-08-20 DIAGNOSIS — Z882 Allergy status to sulfonamides status: Secondary | ICD-10-CM | POA: Insufficient documentation

## 2015-08-20 DIAGNOSIS — N3010 Interstitial cystitis (chronic) without hematuria: Secondary | ICD-10-CM | POA: Diagnosis not present

## 2015-08-20 DIAGNOSIS — R109 Unspecified abdominal pain: Secondary | ICD-10-CM | POA: Diagnosis present

## 2015-08-20 DIAGNOSIS — Z79891 Long term (current) use of opiate analgesic: Secondary | ICD-10-CM | POA: Diagnosis not present

## 2015-08-20 DIAGNOSIS — I10 Essential (primary) hypertension: Secondary | ICD-10-CM | POA: Insufficient documentation

## 2015-08-20 DIAGNOSIS — R3911 Hesitancy of micturition: Secondary | ICD-10-CM | POA: Diagnosis not present

## 2015-08-20 LAB — HEPATIC FUNCTION PANEL
ALT: 28 U/L (ref 14–54)
AST: 25 U/L (ref 15–41)
Albumin: 4.5 g/dL (ref 3.5–5.0)
Alkaline Phosphatase: 105 U/L (ref 38–126)
Bilirubin, Direct: <0.1 mg/dL — ABNORMAL LOW (ref 0.1–0.5)
TOTAL PROTEIN: 7.6 g/dL (ref 6.5–8.1)
Total Bilirubin: 0.5 mg/dL (ref 0.3–1.2)

## 2015-08-20 LAB — CBC
HCT: 41.6 % (ref 35.0–47.0)
HEMOGLOBIN: 14.4 g/dL (ref 12.0–16.0)
MCH: 29 pg (ref 26.0–34.0)
MCHC: 34.7 g/dL (ref 32.0–36.0)
MCV: 83.6 fL (ref 80.0–100.0)
Platelets: 340 10*3/uL (ref 150–440)
RBC: 4.98 MIL/uL (ref 3.80–5.20)
RDW: 14.6 % — AB (ref 11.5–14.5)
WBC: 10.8 10*3/uL (ref 3.6–11.0)

## 2015-08-20 LAB — LIPASE, BLOOD: LIPASE: 18 U/L (ref 11–51)

## 2015-08-20 LAB — BASIC METABOLIC PANEL
ANION GAP: 11 (ref 5–15)
BUN: 6 mg/dL (ref 6–20)
CALCIUM: 10.4 mg/dL — AB (ref 8.9–10.3)
CO2: 24 mmol/L (ref 22–32)
Chloride: 104 mmol/L (ref 101–111)
Creatinine, Ser: 0.87 mg/dL (ref 0.44–1.00)
GFR calc Af Amer: >60 mL/min (ref >60)
GLUCOSE: 122 mg/dL — AB (ref 65–99)
Potassium: 3.6 mmol/L (ref 3.5–5.1)
SODIUM: 139 mmol/L (ref 135–145)

## 2015-08-20 LAB — URINALYSIS COMPLETE WITH MICROSCOPIC (ARMC ONLY)
Bilirubin Urine: NEGATIVE
Glucose, UA: NEGATIVE mg/dL
KETONES UR: NEGATIVE mg/dL
NITRITE: NEGATIVE
PH: 7 (ref 5.0–8.0)
PROTEIN: NEGATIVE mg/dL
Specific Gravity, Urine: 1.003 — ABNORMAL LOW (ref 1.005–1.030)

## 2015-08-20 LAB — TROPONIN I

## 2015-08-20 MED ORDER — MORPHINE SULFATE (PF) 4 MG/ML IV SOLN
4.0000 mg | Freq: Once | INTRAVENOUS | Status: AC
  Administered 2015-08-20: 4 mg via INTRAVENOUS
  Filled 2015-08-20: qty 1

## 2015-08-20 MED ORDER — OXYCODONE-ACETAMINOPHEN 5-325 MG PO TABS
1.0000 | ORAL_TABLET | Freq: Once | ORAL | Status: AC
  Administered 2015-08-20: 1 via ORAL

## 2015-08-20 MED ORDER — SODIUM CHLORIDE 0.9 % IV BOLUS (SEPSIS)
1000.0000 mL | Freq: Once | INTRAVENOUS | Status: AC
Start: 2015-08-20 — End: 2015-08-20
  Administered 2015-08-20: 1000 mL via INTRAVENOUS

## 2015-08-20 MED ORDER — ONDANSETRON HCL 4 MG/2ML IJ SOLN
INTRAMUSCULAR | Status: AC
  Administered 2015-08-20: 4 mg via INTRAVENOUS
  Filled 2015-08-20: qty 2

## 2015-08-20 MED ORDER — MORPHINE SULFATE (PF) 4 MG/ML IV SOLN
4.0000 mg | Freq: Once | INTRAVENOUS | Status: AC
Start: 2015-08-20 — End: 2015-08-20
  Administered 2015-08-20: 4 mg via INTRAVENOUS
  Filled 2015-08-20: qty 1

## 2015-08-20 MED ORDER — ONDANSETRON HCL 4 MG/2ML IJ SOLN
4.0000 mg | Freq: Once | INTRAMUSCULAR | Status: AC
  Administered 2015-08-20: 4 mg via INTRAVENOUS

## 2015-08-20 MED ORDER — FENTANYL CITRATE (PF) 100 MCG/2ML IJ SOLN
INTRAMUSCULAR | Status: AC
  Administered 2015-08-20: 50 ug via INTRAVENOUS
  Filled 2015-08-20: qty 2

## 2015-08-20 MED ORDER — ONDANSETRON HCL 4 MG/2ML IJ SOLN
4.0000 mg | Freq: Once | INTRAMUSCULAR | Status: AC
  Administered 2015-08-20: 4 mg via INTRAVENOUS
  Filled 2015-08-20: qty 2

## 2015-08-20 MED ORDER — IOPAMIDOL (ISOVUE-370) INJECTION 76%
75.0000 mL | Freq: Once | INTRAVENOUS | Status: AC | PRN
  Administered 2015-08-20: 75 mL via INTRAVENOUS

## 2015-08-20 MED ORDER — SODIUM CHLORIDE 0.9 % IV BOLUS (SEPSIS)
1000.0000 mL | Freq: Once | INTRAVENOUS | Status: AC
  Administered 2015-08-20: 1000 mL via INTRAVENOUS

## 2015-08-20 MED ORDER — OXYCODONE-ACETAMINOPHEN 5-325 MG PO TABS
2.0000 | ORAL_TABLET | ORAL | Status: DC
  Filled 2015-08-20: qty 1

## 2015-08-20 MED ORDER — FENTANYL CITRATE (PF) 100 MCG/2ML IJ SOLN
50.0000 ug | INTRAMUSCULAR | Status: AC | PRN
  Administered 2015-08-20 (×2): 50 ug via INTRAVENOUS
  Filled 2015-08-20: qty 2

## 2015-08-20 NOTE — ED Triage Notes (Signed)
Pt presents to ED today with 3 days of right flank pain that radiates to right rib area. Pt with recent left ureter stent placement, which was removed approx 1 week ago.  Pt now with severe R flank pain.

## 2015-08-20 NOTE — ED Provider Notes (Signed)
-----------------------------------------   11:48 PM on 08/20/2015 -----------------------------------------   Blood pressure 118/86, pulse (!) 104, temperature 99.3 F (37.4 C), temperature source Oral, resp. rate (!) 9, height 4\' 11"  (1.499 m), weight 125 lb (56.7 kg), SpO2 95 %.  Assuming care from Dr. Fanny BienQuale of [facility]rios[facility] F Sarr is a 40 y.o. female with a chief complaint of Flank Pain .    In summary, 40 y.o. female reason history of left sided kidney stone, ureteral stenting which was removed about one week ago per the patient who presents for evaluation of R flank/ RUQ abdominal pain.  Labs showing 1+ blood and a UA but no evidence of UTI. Normal CBC, CMP, lipase, troponin. Patient had CTA of the chest with no evidence of PE or PNA. CT of abdomen and pelvis with no acute findings, no hydronephrosis, no stones. Right upper quadrant ultrasound which was negative. Patient continues to have persistent pain and tachycardia after multiple rounds of IV narcotics and fluids. On my reevaluation, patient has no tenderness palpation on her abdomen but has severe flank tenderness on the right. Unclear etiology for the pla. Plan per Dr. Fanny BienQuale was to admit patient if she remained persistently tachycardic and with pain. Will discuss with Hospitalist.   Nita Sicklearolina Caitlin Hillmer, MD 08/20/15 2351

## 2015-08-20 NOTE — ED Provider Notes (Signed)
[facility] Emergency Department Provider Note   ____________________________________________   First MD Initiated Contact with Patient 08/20/15 1636     (approximate)  I have reviewed the triage vital signs and the nursing notes.   HISTORY  Chief Complaint Flank Pain    HPI Ermalinda BarriosMichelle F Savidge is a 40 y.o. female reason history of kidney stone, ureteral stenting which was removed about one week ago per the patient. She was having severe left flank pain at that time.  Patient reports her left-sided abdominal pain went away,however she began experiencing pain in the right upper abdomen that radiates to her right back for the last 3 days. She reports right under the area of her right ribs. No fever cough or shortness of breath. She does report she felt slightly fatigued the last couple days, and has not been hydrating as normal.  No left-sided bowel pain. Denies any changes in urination. No blood in her urine, no pain with urination, no abnormal odor.  Reports her appendix previously been taken out.Marland Kitchen. Hysterectomy.   Past Medical History:  Diagnosis Date  . Ache in joint 02/04/2013  . Addiction, opium (HCC) 01/28/2012  . Anemia    H/O  . Complication of anesthesia   . Endometriosis   . Family history of adverse reaction to anesthesia    DAD-N/V, HEADACHES  . Heart murmur   . History of methicillin resistant staphylococcus aureus (MRSA) 2012  . Hypertension    H/O-BEEN AT LEAST 5 YEARS  . Hypothyroidism   . Interstitial cystitis   . Kidney stones    H/O CHRONIC KIDNEY STONE  . LBP (low back pain) 05/23/2013  . PONV (postoperative nausea and vomiting)   . TIA (transient ischemic attack) 2007    Patient Active Problem List   Diagnosis Date Noted  . Chronic abdominal pain (Location of Primary Source of Pain) (RLQ: right lower quadrant) (Right) 03/25/2015  . Chronic flank pain (Location of Secondary source of pain) (Right) 03/25/2015  . Chronic  groin pain (Location of Tertiary source of pain) (Right) 03/01/2015  . Chronic pain 11/27/2014  . Long term current use of opiate analgesic 11/27/2014  . Long term prescription opiate use 11/27/2014  . Opiate use (380 MME/Day) 11/27/2014  . Encounter for therapeutic drug level monitoring 11/27/2014  . Encounter for chronic pain management 11/27/2014  . Chronic low back pain (Location of Primary Source of Pain) (Bilateral) (R>L) 11/27/2014  . Osteoarthrosis 11/27/2014  . 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 Methadone use (HCC) 11/27/2014  . History of methadone use (HCC) 11/27/2014  . Pain due to interstitial cystitis 11/27/2014  . Chronic interstitial cystitis 11/27/2014  . Chronic pelvic pain (Location of Secondary source of pain) (suprapubic) (Bilateral) (R>L) 11/27/2014  . Chronic hip pain (Right) 11/27/2014  . Obesity, Class I, BMI 30-34.9 (68% higher incidence of chronic low back pain) 11/27/2014  . History of migraine 11/27/2014  . Encounter for long-term (current) use of other high-risk medications 11/27/2014  . 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 Central chest pain 08/17/2013  . Cannot sleep 02/04/2013  . Ache in joint 02/04/2013  . Affective disorder (HCC) 06/16/2012  . Episodic mood disorder (HCC) 06/16/2012  . Flank pain 03/15/2012  . Anxiety state 01/28/2012  . Opioid dependence (HCC) 01/28/2012  . Continuous opioid dependence (HCC) 03/31/2011  . Calculus of kidney 03/31/2011    Past Surgical History:  Procedure Laterality Date  . ABDOMINAL HYSTERECTOMY    .  APPENDECTOMY    . BREAST ENHANCEMENT SURGERY    . carpal tunnel    . CARPAL TUNNEL RELEASE Left 01/28/2015   Procedure: CARPAL TUNNEL RELEASE;  Surgeon: Deeann Saint, MD;  Location: [facility] ORS;  Service: Orthopedics;  Laterality: Left;  . CESAREAN SECTION     X2  . CYSTOSCOPY WITH STENT PLACEMENT Left 07/31/2015   Procedure: CYSTOSCOPY WITH STENT  PLACEMENT;  Surgeon: Hildred Laser, MD;  Location: [facility] ORS;  Service: Urology;  Laterality: Left;  . DIAGNOSTIC LAPAROSCOPY    . LITHOTRIPSY    . URETEROSCOPY WITH HOLMIUM LASER LITHOTRIPSY Left 07/31/2015   Procedure: URETEROSCOPY WITH HOLMIUM LASER LITHOTRIPSY;  Surgeon: Hildred Laser, MD;  Location: [facility] ORS;  Service: Urology;  Laterality: Left;    Prior to Admission medications   Medication Sig Start Date End Date Taking? Authorizing Provider  busPIRone (BUSPAR) 5 MG tablet Take 5 mg by mouth 3 (three) times daily.    Historical Provider, MD  cephALEXin (KEFLEX) 500 MG capsule Take 1 capsule (500 mg total) by mouth 3 (three) times daily. Patient not taking: Reported on 08/09/2015 07/31/15   Hildred Laser, MD  cloNIDine (CATAPRES) 0.1 MG tablet Take 0.1 mg by mouth 3 (three) times daily.    Historical Provider, MD  ondansetron (ZOFRAN ODT) 4 MG disintegrating tablet Take 1 tablet (4 mg total) by mouth every 8 (eight) hours as needed for nausea or vomiting. 07/31/15   Hildred Laser, MD  oxyCODONE-acetaminophen (ROXICET) 5-325 MG tablet Take 1-2 tablets by mouth every 4 (four) hours as needed for severe pain. 07/31/15   Hildred Laser, MD  QUEtiapine (SEROQUEL) 50 MG tablet Take 50 mg by mouth at bedtime.    Historical Provider, MD  tamsulosin (FLOMAX) 0.4 MG CAPS capsule Take 1 capsule (0.4 mg total) by mouth daily. 07/25/15   Hildred Laser, MD    Allergies Codeine; Butorphanol tartrate; Ketorolac; Promethazine; Sumatriptan succinate; Topamax [topiramate]; Toradol [ketorolac tromethamine]; Tramadol; and Sulfa antibiotics  No family history on file.  Social History Social History  Substance Use Topics  . Smoking status: Never Smoker  . Smokeless tobacco: Never Used  . Alcohol use No    Review of Systems Constitutional: No fever/chills. Eyes: No visual changes. ENT: No sore throat. Cardiovascular: Denies chest pain. Respiratory: Denies shortness of  breath. Gastrointestinal:  No diarrhea.  No constipation. Genitourinary: Negative for dysuria. Musculoskeletal: Negative for back pain. Skin: Negative for rash. Neurological: Negative for headaches, focal weakness or numbness.  10-point ROS otherwise negative.  ____________________________________________   PHYSICAL EXAM:  VITAL SIGNS: ED Triage Vitals  Enc Vitals Group     BP 08/20/15 1428 (!) 129/104     Pulse Rate 08/20/15 1428 (!) 130     Resp 08/20/15 1428 20     Temp 08/20/15 1428 99.3 F (37.4 C)     Temp Source 08/20/15 1428 Oral     SpO2 08/20/15 1428 100 %     Weight 08/20/15 1431 125 lb (56.7 kg)     Height 08/20/15 1431 4\' 11"  (1.499 m)     Head Circumference --      Peak Flow --      Pain Score 08/20/15 1431 9     Pain Loc --      Pain Edu? --      Excl. in GC? --     Constitutional: Alert and oriented. Well appearing and in no acute distress. Eyes: Conjunctivae are normal. PERRL. EOMI. Head: Atraumatic.  Nose: No congestion/rhinnorhea. Mouth/Throat: Mucous membranes are moist.  Oropharynx non-erythematous. Neck: No stridor.   Cardiovascular: Normal rate, regular rhythm. Grossly normal heart sounds.  Good peripheral circulation. Respiratory: Normal respiratory effort.  No retractions. Lungs CTAB. Gastrointestinal: Soft and nontender. Equivocal Murphy sign. No tenderness in the right lower abdomen, epigastrium, or left side of the abdomen. No rebound or guarding. Mild to moderate discomfort reported to palpation of the right upper quadrant. No distention. No abdominal bruits. No CVA tenderness. Musculoskeletal: No lower extremity tenderness nor edema.  No joint effusions. Neurologic:  Normal speech and language. No gross focal neurologic deficits are appreciated.  Skin:  Skin is warm, dry and intact. No rash noted. Psychiatric: Mood and affect are normal. Speech and behavior are normal.  ____________________________________________   LABS (all labs  ordered are listed, but only abnormal results are displayed)  Labs Reviewed  URINALYSIS COMPLETEWITH MICROSCOPIC ([facility] ONLY) - Abnormal; Notable for the following:       Result Value   Color, Urine STRAW (*)    APPearance CLEAR (*)    Specific Gravity, Urine 1.003 (*)    Hgb urine dipstick 1+ (*)    Leukocytes, UA TRACE (*)    Bacteria, UA RARE (*)    Squamous Epithelial / LPF 0-5 (*)    All other components within normal limits  BASIC METABOLIC PANEL - Abnormal; Notable for the following:    Glucose, Bld 122 (*)    Calcium 10.4 (*)    All other components within normal limits  CBC - Abnormal; Notable for the following:    RDW 14.6 (*)    All other components within normal limits  TROPONIN I  HEPATIC FUNCTION PANEL  LIPASE, BLOOD   ____________________________________________  EKG  Reviewed and interpreted by me at 1805 Heart rate 75 QRS 90 QTc 4:30 Reviewed and interpreted as normal sinus rhythm, no evidence of ischemic abnormality. ____________________________________________  RADIOLOGY  Dg Chest 2 View  Result Date: 08/20/2015 CLINICAL DATA:  Three days of right-sided flank pain extending to the rib area. Recent left ureteral stent placement, removed proximal 1 week ago. EXAM: CHEST  2 VIEW COMPARISON:  CT of the abdomen and pelvis 08/13/2015. FINDINGS: The heart size and mediastinal contours are within normal limits. Both lungs are clear. The visualized skeletal structures are unremarkable. IMPRESSION: Negative two view chest x-ray Electronically Signed   By: Marin Robertshristopher  Mattern M.D.   On: 08/20/2015 18:47   Ct Angio Chest Pe W Or Wo Contrast  Result Date: 08/20/2015 CLINICAL DATA:  Acute onset of right upper quadrant abdominal pain and right lower chest pain. Right flank pain. Initial encounter. EXAM: CT ANGIOGRAPHY CHEST WITH CONTRAST TECHNIQUE: Multidetector CT imaging of the chest was performed using the standard protocol during bolus administration of intravenous  contrast. Multiplanar CT image reconstructions and MIPs were obtained to evaluate the vascular anatomy. CONTRAST:  75 mL of Isovue 370 IV contrast COMPARISON:  Chest radiograph performed earlier today at 6:23 p.m. FINDINGS: There is no evidence of pulmonary embolus. The lungs are clear bilaterally. There is no evidence of significant focal consolidation, pleural effusion or pneumothorax. No masses are identified; no abnormal focal contrast enhancement is seen. The mediastinum is unremarkable in appearance. No mediastinal lymphadenopathy is seen. No pericardial effusion is identified. The great vessels are grossly unremarkable in appearance. No axillary lymphadenopathy is seen. The visualized portions of the thyroid gland are unremarkable in appearance. The patient's bilateral breast implants are grossly unremarkable in appearance. The visualized portions  of the liver and spleen are unremarkable. The visualized portions of the pancreas, gallbladder, stomach, adrenal glands and left kidney are within normal limits. No acute osseous abnormalities are seen. Review of the MIP images confirms the above findings. IMPRESSION: 1. No evidence of pulmonary embolus. 2. Lungs clear bilaterally. Electronically Signed   By: Roanna Raider M.D.   On: 08/20/2015 20:17   US Abdomen Limited Ruq  Result Date: 08/20/2015 CLINICAL DATA:  Right upper quadrant abdominal pain. EXAM: US ABDOMEN LIMITED - RIGHT UPPER QUADRANT COMPARISON:  CT of the abdomen 08/13/2015 FINDINGS: Gallbladder: No gallstones or wall thickening visualized. No sonographic Murphy sign noted by sonographer. Maximal wall thickness is 2.6 mm, within normal limits. The gallbladder is somewhat under distended. Common bile duct: Diameter: 4.7 mm, within normal limits Liver: No focal lesion identified. Within normal limits in parenchymal echogenicity. IMPRESSION: Negative right upper quadrant ultrasound. Electronically Signed   By: Marin Roberts M.D.   On:  08/20/2015 18:43    ____________________________________________   PROCEDURES  Procedure(s) performed: None  Procedures  Critical Care performed: No  ____________________________________________   INITIAL IMPRESSION / ASSESSMENT AND PLAN / ED COURSE  Pertinent labs & imaging results that were available during my care of the patient were reviewed by me and considered in my medical decision making (see chart for details).  Differential diagnosis includes but is not limited to, abdominal perforation, aortic dissection, cholecystitis, appendicitis, diverticulitis, colitis, esophagitis/gastritis, kidney stone, pyelonephritis, urinary tract infection, aortic aneurysm. All are considered in decision and treatment plan. Based upon the patient's presentation and risk factors, I am concerned about her right-sided abdominal pain which appears to be right upper quadrant versus right lower chest wall in nature. She has reported that she is not having any shortness of breath, but there seems to be a slight pleuritic nature to it and given recent hospitalization consideration is given for pulmonary embolism, EKG reassuring with a negative troponin. Very unlikely coronary disease in this young patient.  No obvious infectious symptoms. Patient does report a recent CT of the abdomen and pelvis. I discussed with the patient the risks and benefits of abdominal CT scan. The present time there is no clear indication that the patient requires CT, the patient does have an abdominal complaint but exam does not suggest acute surgical abdomen and my suspicion for intra-abdominal infection including appendicitis, cholecystitis, aaa, dissection, ischemia, perforation, pancreatitis, diverticulitis or other acute major intra-abdominal process is quite low. After discussing the risks and benefits including benefits of additional evaluation for diagnoses, ruling out infection/perforation/aaa/etc, but also discussing the  risks including low, "well less than 1%," but not 0 risk of inducing cancers due to radiation and potential risks of contrast the patient indicated via our shared medical decision-making that she would not do a CAT scan. Rather if the patient does have worsening symptoms, develops a high fever, develops pain or persistent discomfort in the right upper quadrant or right lower quadrant, or other new concerns arise they will come back to emergency room right away. As the patient's clinician I think this is a very reasonable decision having discussed general risks and benefits of CT, and my clinical suspicion that CT would be of benefit at this time is very low.   Clinical Course   ----------------------------------------- 5:49 PM on 08/20/2015 -----------------------------------------  Patient pain and heart rate improved after medication.   ----------------------------------------- 8:47 PM on 08/20/2015 -----------------------------------------  Fairly thorough evaluation fails to yield an obvious etiology. I discussed with the patient,  and her heart rate has notably increased to 130, she continues to note recurrent of a severe 10 at 10 right upper abdominal pain. I again discussed and offered consideration for repeat CT scan to look for other etiologies such as vascular problem, new kidney stone, or other signs of intra-abdominal infection.  Discussed with the patient, and she reports on remaining intractable abdominal pain. The patient is requesting admission to hospital, and based on the intractability of her pain I discussed with her and we will plan to admit her for a intractability and pain control.  I have added on LFTs which I had not noted as performed initially, but her right upper quadrant ultrasound and CT chest are very reassuring.  ----------------------------------------- 9:25 PM on 08/20/2015 -----------------------------------------  Discussed with the hospitalist service, as  the patient reports intractable pain, and she is notably tachycardic once again. Given no clear etiology for the patient's flank pain, consideration is made for recurrent nephrolithiasis however in discussion with the patient wished to avoid repeat CT imaging.  We will hydrate with additional liter fluid, trial oral Percocet for which the patient reports she takes 1 tablet about every 4-6 hours at home and reassess for pain control. LFTs are pending along with lipase though I suspect they're within normal. Ongoing care assigned to Dr. Darden Dates, plan to reevaluate the patient about one hour, if ongoing intractable pain consult hospitalist service. Follow-up LFTs and pain control. The patient is agreeable with this plan, and if pain is controlled willing to go home for close primary follow-up as well. ____________________________________________   FINAL CLINICAL IMPRESSION(S) / ED DIAGNOSES  Final diagnoses:  Abdominal pain  Right upper quadrant abdominal pain      NEW MEDICATIONS STARTED DURING THIS VISIT:  New Prescriptions   No medications on file     Note:  This document was prepared using Dragon voice recognition software and may include unintentional dictation errors.     Sharyn Creamer, MD 08/20/15 2127

## 2015-08-20 NOTE — Telephone Encounter (Signed)
Pt called complaining of right side kidney pain that started Sunday, August 13th. after CT scan and wonder if the scan showed the right side. I informed her that it did review the right side and no abdomiilites noted. I also informed her if she is having pain that its not urology related and she needs to contact her PCP. The pt verbalize understanding.

## 2015-08-21 DIAGNOSIS — R109 Unspecified abdominal pain: Secondary | ICD-10-CM | POA: Diagnosis present

## 2015-08-21 LAB — HEMOGLOBIN A1C: Hgb A1c MFr Bld: 5.4 % (ref 4.0–6.0)

## 2015-08-21 LAB — TSH: TSH: 3.77 u[IU]/mL (ref 0.350–4.500)

## 2015-08-21 MED ORDER — DOCUSATE SODIUM 100 MG PO CAPS
100.0000 mg | ORAL_CAPSULE | Freq: Two times a day (BID) | ORAL | Status: DC
  Administered 2015-08-21: 100 mg via ORAL
  Filled 2015-08-21: qty 1

## 2015-08-21 MED ORDER — QUETIAPINE FUMARATE 100 MG PO TABS: 100.0000 mg | ORAL_TABLET | Freq: Every day | ORAL | Status: DC

## 2015-08-21 MED ORDER — SODIUM CHLORIDE 0.9 % IV SOLN
INTRAVENOUS | Status: DC
  Administered 2015-08-21: 04:00:00 via INTRAVENOUS

## 2015-08-21 MED ORDER — ACETAMINOPHEN 325 MG PO TABS
650.0000 mg | ORAL_TABLET | Freq: Four times a day (QID) | ORAL | Status: DC | PRN
  Administered 2015-08-21 (×2): 650 mg via ORAL
  Filled 2015-08-21 (×2): qty 2

## 2015-08-21 MED ORDER — OXYCODONE HCL 5 MG PO TABS
10.0000 mg | ORAL_TABLET | Freq: Four times a day (QID) | ORAL | Status: DC | PRN
Start: 2015-08-21 — End: 2015-08-21
  Administered 2015-08-21 (×2): 10 mg via ORAL
  Filled 2015-08-21 (×2): qty 2

## 2015-08-21 MED ORDER — ENOXAPARIN SODIUM 40 MG/0.4ML ~~LOC~~ SOLN
40.0000 mg | SUBCUTANEOUS | Status: DC
  Administered 2015-08-21: 40 mg via SUBCUTANEOUS
  Filled 2015-08-21: qty 0.4

## 2015-08-21 MED ORDER — MORPHINE SULFATE (PF) 2 MG/ML IV SOLN
2.0000 mg | INTRAVENOUS | Status: DC | PRN
  Administered 2015-08-21 (×2): 2 mg via INTRAVENOUS
  Filled 2015-08-21 (×2): qty 1

## 2015-08-21 MED ORDER — ONDANSETRON HCL 4 MG PO TABS: 4.0000 mg | ORAL_TABLET | Freq: Four times a day (QID) | ORAL | Status: DC | PRN

## 2015-08-21 MED ORDER — CLONIDINE HCL 0.1 MG PO TABS
0.1000 mg | ORAL_TABLET | Freq: Three times a day (TID) | ORAL | Status: DC
  Administered 2015-08-21: 0.1 mg via ORAL
  Filled 2015-08-21: qty 1

## 2015-08-21 MED ORDER — SODIUM CHLORIDE 0.9% FLUSH
3.0000 mL | Freq: Two times a day (BID) | INTRAVENOUS | Status: DC
  Administered 2015-08-21: 3 mL via INTRAVENOUS

## 2015-08-21 MED ORDER — TAMSULOSIN HCL 0.4 MG PO CAPS
0.4000 mg | ORAL_CAPSULE | Freq: Every day | ORAL | Status: DC
  Administered 2015-08-21: 0.4 mg via ORAL
  Filled 2015-08-21: qty 1

## 2015-08-21 MED ORDER — BUSPIRONE HCL 5 MG PO TABS
5.0000 mg | ORAL_TABLET | Freq: Three times a day (TID) | ORAL | Status: DC
  Administered 2015-08-21: 5 mg via ORAL
  Filled 2015-08-21: qty 1

## 2015-08-21 MED ORDER — ONDANSETRON HCL 4 MG/2ML IJ SOLN
4.0000 mg | Freq: Four times a day (QID) | INTRAMUSCULAR | Status: DC | PRN
  Administered 2015-08-21 (×2): 4 mg via INTRAVENOUS
  Filled 2015-08-21 (×2): qty 2

## 2015-08-21 MED ORDER — ACETAMINOPHEN 650 MG RE SUPP: 650.0000 mg | Freq: Four times a day (QID) | RECTAL | Status: DC | PRN

## 2015-08-21 MED ORDER — ONDANSETRON 4 MG PO TBDP: 4.0000 mg | ORAL_TABLET | Freq: Three times a day (TID) | ORAL | Status: DC | PRN

## 2015-08-21 NOTE — H&P (Signed)
[name] is an 40 y.o. female.   Chief Complaint: Abdominal pain HPI: The patient with past medical history of endometriosis as well as kidney stones presents to the emergency department complaining of right lower quadrant abdominal pain. The patient states that it began today and that she has had a few episodes of nonbloody nonbilious emesis. Occasionally she still feels nauseous. In the emergency department imaging revealed no acute intra-abdominal process. 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 Renal calculi and hydronephrosis has resolved since the patient was evaluated last month. She received multiple doses of pain medication in the emergency department without relief which prompted staff to call the hospitalist service for admission.  Past Medical History:  Diagnosis Date  . Ache in joint 02/04/2013  . Addiction, opium ([place]) 01/28/2012  . Anemia    H/O  . Complication of anesthesia   . Endometriosis   . Family history of adverse reaction to anesthesia    DAD-N/V, HEADACHES  . Heart murmur   . History of methicillin resistant staphylococcus aureus (MRSA) 2012  . Hypertension    H/O-BEEN AT LEAST 5 YEARS  . Hypothyroidism   . Interstitial cystitis   . Kidney stones    H/O CHRONIC KIDNEY STONE  . LBP (low back pain) 05/23/2013  . PONV (postoperative nausea and vomiting)   . TIA (transient ischemic attack) 2007    Past Surgical History:  Procedure Laterality Date  . ABDOMINAL HYSTERECTOMY    . APPENDECTOMY    . BREAST ENHANCEMENT SURGERY    . carpal tunnel    . CARPAL TUNNEL RELEASE Left 01/28/2015   Procedure: CARPAL TUNNEL RELEASE;  Surgeon: Earnestine Leys, MD;  Location: [facility] ORS;  Service: Orthopedics;  Laterality: Left;  . CESAREAN SECTION     X2  . CYSTOSCOPY WITH STENT PLACEMENT Left 07/31/2015   Procedure: CYSTOSCOPY WITH STENT PLACEMENT;  Surgeon: Nickie Retort, MD;  Location: [facility];  Service: Urology;   Laterality: Left;  . DIAGNOSTIC LAPAROSCOPY    . LITHOTRIPSY    . URETEROSCOPY WITH HOLMIUM LASER LITHOTRIPSY Left 07/31/2015   Procedure: URETEROSCOPY WITH HOLMIUM LASER LITHOTRIPSY;  Surgeon: Nickie Retort, MD;  Location: [facility];  Service: Urology;  Laterality: Left;    No family history on file. Social History:  reports that she has never smoked. She has never used smokeless tobacco. She reports that she does not drink alcohol or use drugs.  Allergies:  Allergies  Allergen Reactions  . Codeine     Other reaction(s): Other (See Comments) Other Reaction: OTHER REACTION - ITCHING, SWEL  . Butorphanol Tartrate Itching and Other (See Comments)    Other reaction(s): Other (See Comments) hallucinations Hallucinations  . Ketorolac     Other reaction(s): Other (See Comments) Burns face  . Promethazine     Other reaction(s): Other (See Comments) Arm spasms Other reaction(s): Other (See Comments) Other Reaction: ARM SPASMS Other reaction(s): Other (See Comments) Arm spasms  . Sumatriptan Succinate     Other reaction(s): Other (See Comments) Other Reaction: BURNING OF SKIN  . Topamax [Topiramate]     Muscles spams  . Toradol [Ketorolac Tromethamine] Other (See Comments)    Other reaction(s): Other (See Comments) Burns face Muscle spasms  . Tramadol     Other reaction(s): Other (See Comments) Other Reaction: IRRITABILITY AND INSOMNIA MUSCLE SPASMS IN ARMS  . Sulfa Antibiotics Rash    Medications Prior to Admission  Medication Sig Dispense Refill  .  busPIRone (BUSPAR) 5 MG tablet Take 5 mg by mouth 3 (three) times daily.    . cloNIDine (CATAPRES) 0.1 MG tablet Take 0.1 mg by mouth 3 (three) times daily.    . Oxycodone HCl 10 MG TABS Take 10 mg by mouth every 6 (six) hours as needed (pain).     . QUEtiapine (SEROQUEL) 100 MG tablet Take 100 mg by mouth at bedtime.    . tamsulosin (FLOMAX) 0.4 MG CAPS capsule Take 1 capsule (0.4 mg total) by mouth daily. 30 capsule 0  .  cephALEXin (KEFLEX) 500 MG capsule Take 1 capsule (500 mg total) by mouth 3 (three) times daily. (Patient not taking: Reported on 08/09/2015) 6 capsule 0  . ondansetron (ZOFRAN ODT) 4 MG disintegrating tablet Take 1 tablet (4 mg total) by mouth every 8 (eight) hours as needed for nausea or vomiting. (Patient not taking: Reported on 08/20/2015) 20 tablet 0  . oxyCODONE-acetaminophen (ROXICET) 5-325 MG tablet Take 1-2 tablets by mouth every 4 (four) hours as needed for severe pain. (Patient not taking: Reported on 08/20/2015) 30 tablet 0    Results for orders placed or performed during the hospital encounter of 08/20/15 (from the past 48 hour(s))  Urinalysis complete, with microscopic- may I&O cath if menses     Status: Abnormal   Collection Time: 08/20/15  2:20 PM  Result Value Ref Range   Color, Urine STRAW (A) YELLOW   APPearance CLEAR (A) CLEAR   Glucose, UA NEGATIVE NEGATIVE mg/dL   Bilirubin Urine NEGATIVE NEGATIVE   Ketones, ur NEGATIVE NEGATIVE mg/dL   Specific Gravity, Urine 1.003 (L) 1.005 - 1.030   Hgb urine dipstick 1+ (A) NEGATIVE   pH 7.0 5.0 - 8.0   Protein, ur NEGATIVE NEGATIVE mg/dL   Nitrite NEGATIVE NEGATIVE   Leukocytes, UA TRACE (A) NEGATIVE   RBC / HPF 0-5 0 - 5 RBC/hpf   WBC, UA 0-5 0 - 5 WBC/hpf   Bacteria, UA RARE (A) NONE SEEN   Squamous Epithelial / LPF 0-5 (A) NONE SEEN  Basic metabolic panel     Status: Abnormal   Collection Time: 08/20/15  2:20 PM  Result Value Ref Range   Sodium 139 135 - 145 mmol/L   Potassium 3.6 3.5 - 5.1 mmol/L   Chloride 104 101 - 111 mmol/L   CO2 24 22 - 32 mmol/L   Glucose, Bld 122 (H) 65 - 99 mg/dL   BUN 6 6 - 20 mg/dL   Creatinine, Ser 0.87 0.44 - 1.00 mg/dL   Calcium 10.4 (H) 8.9 - 10.3 mg/dL   GFR calc non Af Amer >60 >60 mL/min   GFR calc Af Amer >60 >60 mL/min    Comment: (NOTE) The eGFR has been calculated using the CKD EPI equation. This calculation has not been validated in all clinical situations. eGFR's persistently  <60 mL/min signify possible Chronic Kidney Disease.    Anion gap 11 5 - 15  CBC     Status: Abnormal   Collection Time: 08/20/15  2:20 PM  Result Value Ref Range   WBC 10.8 3.6 - 11.0 K/uL   RBC 4.98 3.80 - 5.20 MIL/uL   Hemoglobin 14.4 12.0 - 16.0 g/dL   HCT 41.6 35.0 - 47.0 %   MCV 83.6 80.0 - 100.0 fL   MCH 29.0 26.0 - 34.0 pg   MCHC 34.7 32.0 - 36.0 g/dL   RDW 14.6 (H) 11.5 - 14.5 %   Platelets 340 150 - 440 K/uL  Troponin  I     Status: None   Collection Time: 08/20/15  2:20 PM  Result Value Ref Range   Troponin I <0.03 <0.03 ng/mL  Hepatic function panel     Status: Abnormal   Collection Time: 08/20/15  2:20 PM  Result Value Ref Range   Total Protein 7.6 6.5 - 8.1 g/dL   Albumin 4.5 3.5 - 5.0 g/dL   AST 25 15 - 41 U/L   ALT 28 14 - 54 U/L   Alkaline Phosphatase 105 38 - 126 U/L   Total Bilirubin 0.5 0.3 - 1.2 mg/dL   Bilirubin, Direct <0.1 (L) 0.1 - 0.5 mg/dL   Indirect Bilirubin NOT CALCULATED 0.3 - 0.9 mg/dL  Lipase, blood     Status: None   Collection Time: 08/20/15  2:20 PM  Result Value Ref Range   Lipase 18 11 - 51 U/L  TSH     Status: None   Collection Time: 08/20/15  2:20 PM  Result Value Ref Range   TSH 3.770 0.350 - 4.500 uIU/mL   Dg Chest 2 View  Result Date: 08/20/2015 CLINICAL DATA:  Three days of right-sided flank pain extending to the rib area. Recent left ureteral stent placement, removed proximal 1 week ago. EXAM: CHEST  2 VIEW COMPARISON:  CT of the abdomen and pelvis 08/13/2015. FINDINGS: The heart size and mediastinal contours are within normal limits. Both lungs are clear. The visualized skeletal structures are unremarkable. IMPRESSION: Negative two view chest x-ray Electronically Signed   By: San Morelle M.D.   On: 08/20/2015 18:47   Ct Angio Chest Pe W Or Wo Contrast  Result Date: 08/20/2015 CLINICAL DATA:  Acute onset of right upper quadrant abdominal pain and right lower chest pain. Right flank pain. Initial encounter. EXAM: CT  ANGIOGRAPHY CHEST WITH CONTRAST TECHNIQUE: Multidetector CT imaging of the chest was performed using the standard protocol during bolus administration of intravenous contrast. Multiplanar CT image reconstructions and MIPs were obtained to evaluate the vascular anatomy. CONTRAST:  75 mL of Isovue 370 IV contrast COMPARISON:  Chest radiograph performed earlier today at 6:23 p.m. FINDINGS: There is no evidence of pulmonary embolus. The lungs are clear bilaterally. There is no evidence of significant focal consolidation, pleural effusion or pneumothorax. No masses are identified; no abnormal focal contrast enhancement is seen. The mediastinum is unremarkable in appearance. No mediastinal lymphadenopathy is seen. No pericardial effusion is identified. The great vessels are grossly unremarkable in appearance. No axillary lymphadenopathy is seen. The visualized portions of the thyroid gland are unremarkable in appearance. The patient's bilateral breast implants are grossly unremarkable in appearance. The visualized portions of the liver and spleen are unremarkable. The visualized portions of the pancreas, gallbladder, stomach, adrenal glands and left kidney are within normal limits. No acute osseous abnormalities are seen. Review of the MIP images confirms the above findings. IMPRESSION: 1. No evidence of pulmonary embolus. 2. Lungs clear bilaterally. Electronically Signed   By: Garald Balding M.D.   On: 08/20/2015 20:17   Ct Renal Stone Study  Result Date: 08/20/2015 CLINICAL DATA:  New onset pain in the right upper abdomen radiating to the right back for 3 days. Fatigue. Recent ureteral stenting for a kidney stone. EXAM: CT ABDOMEN AND PELVIS WITHOUT CONTRAST TECHNIQUE: Multidetector CT imaging of the abdomen and pelvis was performed following the standard protocol without IV contrast. COMPARISON:  08/13/2015 FINDINGS: The lung bases are clear.  Bilateral breast implants. There is residual contrast material in the  renal collecting  systems, ureters, and bladder are from previous CT chest done earlier today. There is no evidence of hydronephrosis or ureteral obstruction. A nonobstructing stone could be obscured by the contrast material. The unenhanced appearance of the liver, spleen, gallbladder, pancreas, adrenal glands, abdominal aorta, inferior vena cava, and retroperitoneal lymph nodes is unremarkable. Stomach, small bowel, and colon are not abnormally distended. No free air or free fluid in the abdomen. Pelvis: Surgical absence of the appendix. Bladder wall is not thickened. No free or loculated pelvic fluid collections. No pelvic mass or lymphadenopathy. No destructive bone lesions. IMPRESSION: Residual contrast material limits evaluation of the urinary tract. There is no evidence of hydronephrosis or ureteral obstruction. Electronically Signed   By: Lucienne Capers M.D.   On: 08/20/2015 23:24   US Abdomen Limited Ruq  Result Date: 08/20/2015 CLINICAL DATA:  Right upper quadrant abdominal pain. EXAM: US ABDOMEN LIMITED - RIGHT UPPER QUADRANT COMPARISON:  CT of the abdomen 08/13/2015 FINDINGS: Gallbladder: No gallstones or wall thickening visualized. No sonographic Murphy sign noted by sonographer. Maximal wall thickness is 2.6 mm, within normal limits. The gallbladder is somewhat under distended. Common bile duct: Diameter: 4.7 mm, within normal limits Liver: No focal lesion identified. Within normal limits in parenchymal echogenicity. IMPRESSION: Negative right upper quadrant ultrasound. Electronically Signed   By: San Morelle M.D.   On: 08/20/2015 18:43    Review of Systems  Constitutional: Negative for chills and fever.  HENT: Negative for sore throat and tinnitus.   Eyes: Negative for blurred vision and redness.  Respiratory: Negative for cough and shortness of breath.   Cardiovascular: Negative for chest pain, palpitations, orthopnea and PND.  Gastrointestinal: Positive for abdominal pain and  nausea. Negative for diarrhea and vomiting.  Genitourinary: Negative for dysuria, frequency and urgency.  Musculoskeletal: Negative for joint pain and myalgias.  Skin: Negative for rash.       No lesions  Neurological: Negative for speech change, focal weakness and weakness.  Endo/Heme/Allergies: Does not bruise/bleed easily.       No temperature intolerance  Psychiatric/Behavioral: Negative for depression and suicidal ideas.    Blood pressure 119/79, pulse (!) 106, temperature 98.3 F (36.8 C), temperature source Oral, resp. rate 20, height _0  (1.499 m), weight 57.3 kg (126 lb 6.4 oz), SpO2 100 %. Physical Exam  Vitals reviewed. Constitutional: She is oriented to person, place, and time. She appears well-developed and well-nourished. No distress.  HENT:  Head: Normocephalic and atraumatic.  Mouth/Throat: Oropharynx is clear and moist.  Eyes: Conjunctivae and EOM are normal. Pupils are equal, round, and reactive to light. No scleral icterus.  Neck: Normal range of motion. Neck supple. No JVD present. No tracheal deviation present. No thyromegaly present.  Cardiovascular: Normal rate, regular rhythm and normal heart sounds.  Exam reveals no gallop and no friction rub.   No murmur heard. Respiratory: Effort normal and breath sounds normal.  GI: Soft. Bowel sounds are normal. She exhibits no distension and no mass. There is tenderness. There is guarding (Voluntary). There is no rebound.  Genitourinary:  Genitourinary Comments: Deferred  Musculoskeletal: Normal range of motion. She exhibits no edema.  Lymphadenopathy:    She has no cervical adenopathy.  Neurological: She is alert and oriented to person, place, and time. No cranial nerve deficit. She exhibits normal muscle tone.  Skin: Skin is warm and dry. No rash noted. No erythema.  Psychiatric: She has a normal mood and affect. Her behavior is normal. Judgment and thought content normal.  Assessment/Plan This is a  40 year old female admitted for intractable abdominal pain. 1. Abdominal pain: Intractable. Manage pain as best as possible. Etiology is unclear at this time as the periaortic mass on the opposite side of her abdomen as her pain. Also, recent kidney stones were also on the left where as her pain today is on the right side of her abdomen. 2. Essential hypertension: Relatively controlled; outliers likely secondary to pain. Continue clonidine (it is unclear why this is the patient's medication of choice) 3. Depression: Continue BuSpar and Seroquel 4. Urinary hesitancy: Continue Flomax 5. DVT prophylaxis: Lovenox 6. GI prophylaxis: None The patient is a full code. Time spent on admission orders and patient care approximately 45 minutes  Harrie Foreman, MD 08/21/2015, 6:00 AM

## 2015-08-21 NOTE — Discharge Planning (Signed)
Patient IV and tele removed.  Discharge papers given explained and educated.  DC checklist complete.  FU set up for PCP and Left message on machine for pain clinic for 1 wk FU.  Patient informed of FU appts suggested and set up.  Script given for pain meds (10 pills).  RN assessment and VS revealed stability for DC to home with no further assistance.  Patient will be wheeled to front and family transporting home via car when ready.

## 2015-08-21 NOTE — Discharge Summary (Signed)
[facility] - [place] at [facility]   PATIENT NAME: [name]    MR#:  [id number]  DATE OF BIRTH:  Aug 17, 1975  DATE OF ADMISSION:  08/20/2015 ADMITTING PHYSICIAN: Arnaldo Natal, MD  DATE OF DISCHARGE: 08/21/15  PRIMARY CARE PHYSICIAN: Domenic Schwab, FNP    ADMISSION DIAGNOSIS:  Right upper quadrant abdominal pain [R10.11] Abdominal pain [R10.9]  DISCHARGE DIAGNOSIS:  Chronic abdominal pain  H/o Narcotic dependence  SECONDARY DIAGNOSIS:   Past Medical History:  Diagnosis Date  . Ache in joint 02/04/2013  . Addiction, opium (HCC) 01/28/2012  . Anemia    H/O  . Complication of anesthesia   . Endometriosis   . Family history of adverse reaction to anesthesia    DAD-N/V, HEADACHES  . Heart murmur   . History of methicillin resistant staphylococcus aureus (MRSA) 2012  . Hypertension    H/O-BEEN AT LEAST 5 YEARS  . Hypothyroidism   . Interstitial cystitis   . Kidney stones    H/O CHRONIC KIDNEY STONE  . LBP (low back pain) 05/23/2013  . PONV (postoperative nausea and vomiting)   . TIA (transient ischemic attack) 2007    HOSPITAL COURSE:   40 year old female admitted for intractable abdominal pain. 1. 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 Worrisome for narcotic seeking given h/o chronic pain and narcotic use -I have asked her to see dr Laban Emperor in 1 week. I will give her only 10 pills for oxycodone  2. Essential hypertension: Relatively controlled; outliers likely secondary to pain.  Continue clonidine   3. Depression: Continue BuSpar and Seroquel  4. Urinary hesitancy: Continue Flomax  5. DVT prophylaxis: Lovenox  6. GI prophylaxis: None  Advance to soft diet and d/c home today CONSULTS  OBTAINED:    DRUG ALLERGIES:   Allergies  Allergen Reactions  . Codeine     Other reaction(s): Other (See Comments) Other Reaction: OTHER REACTION - ITCHING, SWEL  . Butorphanol Tartrate Itching and Other (See Comments)    Other reaction(s): Other (See Comments) hallucinations Hallucinations  . Ketorolac     Other reaction(s): Other (See Comments) Burns face  . Promethazine     Other reaction(s): Other (See Comments) Arm spasms Other reaction(s): Other (See Comments) Other Reaction: ARM SPASMS Other reaction(s): Other (See Comments) Arm spasms  . Sumatriptan Succinate     Other reaction(s): Other (See Comments) Other Reaction: BURNING OF SKIN  . Topamax [Topiramate]     Muscles spams  . Toradol [Ketorolac Tromethamine] Other (See Comments)    Other reaction(s): Other (See Comments) Burns face Muscle spasms  . Tramadol     Other reaction(s): Other (See Comments) Other Reaction: IRRITABILITY AND INSOMNIA MUSCLE SPASMS IN ARMS  . Sulfa Antibiotics Rash    DISCHARGE MEDICATIONS:   Current Discharge Medication List    CONTINUE these medications which have NOT CHANGED   Details  busPIRone (BUSPAR) 5 MG tablet Take 5 mg by mouth 3 (three) times daily.    cloNIDine (CATAPRES) 0.1 MG tablet Take 0.1 mg by mouth 3 (three) times daily.    Oxycodone HCl 10 MG TABS Take 10 mg by mouth every 6 (six) hours as needed (pain).     QUEtiapine (SEROQUEL) 100 MG tablet Take 100 mg by  mouth at bedtime.    tamsulosin (FLOMAX) 0.4 MG CAPS capsule Take 1 capsule (0.4 mg total) by mouth daily. Qty: 30 capsule, Refills: 0    ondansetron (ZOFRAN ODT) 4 MG disintegrating tablet Take 1 tablet (4 mg total) by mouth every 8 (eight) hours as needed for nausea or vomiting. Qty: 20 tablet, Refills: 0      STOP taking these medications     cephALEXin (KEFLEX) 500 MG capsule      oxyCODONE-acetaminophen (ROXICET) 5-325 MG tablet         If you experience worsening of your admission  symptoms, develop shortness of breath, life threatening emergency, suicidal or homicidal thoughts you must seek medical attention immediately by calling 911 or calling your MD immediately  if symptoms less severe.  You Must read complete instructions/literature along with all the possible adverse reactions/side effects for all the Medicines you take and that have been prescribed to you. Take any new Medicines after you have completely understood and accept all the possible adverse reactions/side effects.   Please note  You were cared for by a hospitalist during your hospital stay. If you have any questions about your discharge medications or the care you received while you were in the hospital after you are discharged, you can call the unit and asked to speak with the hospitalist on call if the hospitalist that took care of you is not available. Once you are discharged, your primary care physician will handle any further medical issues. Please note that NO REFILLS for any discharge medications will be authorized once you are discharged, as it is imperative that you return to your primary care physician (or establish a relationship with a primary care physician if you do not have one) for your aftercare needs so that they can reassess your need for medications and monitor your lab values. Today   SUBJECTIVE   headache  VITAL SIGNS:  Blood pressure (!) 131/91, pulse 73, temperature 97.9 F (36.6 C), temperature source Oral, resp. rate 16, height 4\' 11"  (1.499 m), weight 126 lb 6.4 oz (57.3 kg), SpO2 100 %.  I/O:   Intake/Output Summary (Last 24 hours) at 08/21/15 1400 Last data filed at 08/21/15 1300  Gross per 24 hour  Intake          1261.52 ml  Output              400 ml  Net           861.52 ml    PHYSICAL EXAMINATION:  GENERAL:  40 y.o.-year-old patient lying in the bed with no acute distress.  EYES: Pupils equal, round, reactive to light and accommodation. No scleral icterus.  Extraocular muscles intact.  HEENT: Head atraumatic, normocephalic. Oropharynx and nasopharynx clear.  NECK:  Supple, no jugular venous distention. No thyroid enlargement, no tenderness.  LUNGS: Normal breath sounds bilaterally, no wheezing, rales,rhonchi or crepitation. No use of accessory muscles of respiration.  CARDIOVASCULAR: S1, S2 normal. No murmurs, rubs, or gallops.  ABDOMEN: Soft, non-tender, non-distended. Bowel sounds present. No organomegaly or mass.  EXTREMITIES: No pedal edema, cyanosis, or clubbing.  NEUROLOGIC: Cranial nerves II through XII are intact. Muscle strength 5/5 in all extremities. Sensation intact. Gait not checked.  PSYCHIATRIC: The patient is alert and oriented x 3.  SKIN: No obvious rash, lesion, or ulcer.   DATA REVIEW:   CBC   Recent Labs Lab 08/20/15 1420  WBC 10.8  HGB 14.4  HCT 41.6  PLT 340  Chemistries   Recent Labs Lab 08/20/15 1420  NA 139  K 3.6  CL 104  CO2 24  GLUCOSE 122*  BUN 6  CREATININE 0.87  CALCIUM 10.4*  AST 25  ALT 28  ALKPHOS 105  BILITOT 0.5    Microbiology Results   Recent Results (from the past 240 hour(s))  Urine culture     Status: None   Collection Time: 08/13/15  5:59 PM  Result Value Ref Range Status   Specimen Description URINE, CLEAN CATCH  Final   Special Requests NONE  Final   Culture NO GROWTH [facility]   Final   Report Status 08/14/2015 FINAL  Final    RADIOLOGY:  Dg Chest 2 View  Result Date: 08/20/2015 CLINICAL DATA:  Three days of right-sided flank pain extending to the rib area. Recent left ureteral stent placement, removed proximal 1 week ago. EXAM: CHEST  2 VIEW COMPARISON:  CT of the abdomen and pelvis 08/13/2015. FINDINGS: The heart size and mediastinal contours are within normal limits. Both lungs are clear. The visualized skeletal structures are unremarkable. IMPRESSION: Negative two view chest x-ray Electronically Signed   By: Marin Robertshristopher  Mattern M.D.    On: 08/20/2015 18:47   Ct Angio Chest Pe W Or Wo Contrast  Result Date: 08/20/2015 CLINICAL DATA:  Acute onset of right upper quadrant abdominal pain and right lower chest pain. Right flank pain. Initial encounter. EXAM: CT ANGIOGRAPHY CHEST WITH CONTRAST TECHNIQUE: Multidetector CT imaging of the chest was performed using the standard protocol during bolus administration of intravenous contrast. Multiplanar CT image reconstructions and MIPs were obtained to evaluate the vascular anatomy. CONTRAST:  75 mL of Isovue 370 IV contrast COMPARISON:  Chest radiograph performed earlier today at 6:23 p.m. FINDINGS: There is no evidence of pulmonary embolus. The lungs are clear bilaterally. There is no evidence of significant focal consolidation, pleural effusion or pneumothorax. No masses are identified; no abnormal focal contrast enhancement is seen. The mediastinum is unremarkable in appearance. No mediastinal lymphadenopathy is seen. No pericardial effusion is identified. The great vessels are grossly unremarkable in appearance. No axillary lymphadenopathy is seen. The visualized portions of the thyroid gland are unremarkable in appearance. The patient's bilateral breast implants are grossly unremarkable in appearance. The visualized portions of the liver and spleen are unremarkable. The visualized portions of the pancreas, gallbladder, stomach, adrenal glands and left kidney are within normal limits. No acute osseous abnormalities are seen. Review of the MIP images confirms the above findings. IMPRESSION: 1. No evidence of pulmonary embolus. 2. Lungs clear bilaterally. Electronically Signed   By: Roanna RaiderJeffery  Chang M.D.   On: 08/20/2015 20:17   Ct Renal Stone Study  Result Date: 08/20/2015 CLINICAL DATA:  New onset pain in the right upper abdomen radiating to the right back for 3 days. Fatigue. Recent ureteral stenting for a kidney stone. EXAM: CT ABDOMEN AND PELVIS WITHOUT CONTRAST TECHNIQUE: Multidetector CT  imaging of the abdomen and pelvis was performed following the standard protocol without IV contrast. COMPARISON:  08/13/2015 FINDINGS: The lung bases are clear.  Bilateral breast implants. There is residual contrast material in the renal collecting systems, ureters, and bladder are from previous CT chest done earlier today. There is no evidence of hydronephrosis or ureteral obstruction. A nonobstructing stone could be obscured by the contrast material. The unenhanced appearance of the liver, spleen, gallbladder, pancreas, adrenal glands, abdominal aorta, inferior vena cava, and retroperitoneal lymph nodes is unremarkable. Stomach, small bowel, and colon are  not abnormally distended. No free air or free fluid in the abdomen. Pelvis: Surgical absence of the appendix. Bladder wall is not thickened. No free or loculated pelvic fluid collections. No pelvic mass or lymphadenopathy. No destructive bone lesions. IMPRESSION: Residual contrast material limits evaluation of the urinary tract. There is no evidence of hydronephrosis or ureteral obstruction. Electronically Signed   By: Burman NievesWilliam  Stevens M.D.   On: 08/20/2015 23:24   [place] Abdomen Limited Ruq  Result Date: 08/20/2015 CLINICAL DATA:  Right upper quadrant abdominal pain. EXAM: US ABDOMEN LIMITED - RIGHT UPPER QUADRANT COMPARISON:  CT of the abdomen 08/13/2015 FINDINGS: Gallbladder: No gallstones or wall thickening visualized. No sonographic Murphy sign noted by sonographer. Maximal wall thickness is 2.6 mm, within normal limits. The gallbladder is somewhat under distended. Common bile duct: Diameter: 4.7 mm, within normal limits Liver: No focal lesion identified. Within normal limits in parenchymal echogenicity. IMPRESSION: Negative right upper quadrant ultrasound. Electronically Signed   By: Marin Robertshristopher  Mattern M.D.   On: 08/20/2015 18:43     Management plans discussed with the patient, family and they are in agreement.  CODE STATUS:     Code Status  Orders        Start     Ordered   08/21/15 0358  Full code  Continuous     08/21/15 0358    Code Status History    Date Active Date Inactive Code Status Order ID Comments User Context   08/01/2014  4:57 PM 08/02/2014  8:42 PM Full Code [id number]  Beau FannySurya K Challa, MD Inpatient      TOTAL TIME TAKING CARE OF THIS PATIENT: 40 minutes.    Bashar Milam M.D on 08/21/2015 at 2:00 PM  Between 7am to 6pm - Pager - [phone] After 6pm go to [email or web address] - password [email or web address] [facility]  [facility]  Office  [phone]  CC: Primary care physician; Domenic SchwabLindley,Cheryl Paulette, FNP

## 2015-09-05 ENCOUNTER — Encounter: Payer: Self-pay | Admitting: Urology

## 2015-09-05 ENCOUNTER — Ambulatory Visit: Payer: BLUE CROSS/BLUE SHIELD | Admitting: Urology

## 2015-09-16 ENCOUNTER — Encounter: Payer: Self-pay | Admitting: Pain Medicine

## 2015-09-16 ENCOUNTER — Ambulatory Visit: Payer: BLUE CROSS/BLUE SHIELD | Attending: Pain Medicine | Admitting: Pain Medicine

## 2015-09-16 VITALS — BP 97/75 | HR 107 | Temp 97.9°F | Resp 16 | Ht 60.0 in | Wt 125.0 lb

## 2015-09-16 DIAGNOSIS — Z9114 Patient's other noncompliance with medication regimen: Secondary | ICD-10-CM | POA: Insufficient documentation

## 2015-09-16 DIAGNOSIS — F1190 Opioid use, unspecified, uncomplicated: Secondary | ICD-10-CM | POA: Diagnosis not present

## 2015-09-16 DIAGNOSIS — F419 Anxiety disorder, unspecified: Secondary | ICD-10-CM | POA: Diagnosis not present

## 2015-09-16 DIAGNOSIS — N3010 Interstitial cystitis (chronic) without hematuria: Secondary | ICD-10-CM | POA: Diagnosis not present

## 2015-09-16 DIAGNOSIS — F39 Unspecified mood [affective] disorder: Secondary | ICD-10-CM | POA: Diagnosis not present

## 2015-09-16 DIAGNOSIS — M25551 Pain in right hip: Secondary | ICD-10-CM | POA: Diagnosis not present

## 2015-09-16 DIAGNOSIS — R1031 Right lower quadrant pain: Secondary | ICD-10-CM | POA: Diagnosis not present

## 2015-09-16 DIAGNOSIS — M545 Low back pain, unspecified: Secondary | ICD-10-CM

## 2015-09-16 DIAGNOSIS — Z90710 Acquired absence of both cervix and uterus: Secondary | ICD-10-CM | POA: Diagnosis not present

## 2015-09-16 DIAGNOSIS — R1030 Lower abdominal pain, unspecified: Secondary | ICD-10-CM

## 2015-09-16 DIAGNOSIS — E669 Obesity, unspecified: Secondary | ICD-10-CM | POA: Insufficient documentation

## 2015-09-16 DIAGNOSIS — N200 Calculus of kidney: Secondary | ICD-10-CM | POA: Insufficient documentation

## 2015-09-16 DIAGNOSIS — F338 Other recurrent depressive disorders: Secondary | ICD-10-CM | POA: Insufficient documentation

## 2015-09-16 DIAGNOSIS — F1120 Opioid dependence, uncomplicated: Secondary | ICD-10-CM

## 2015-09-16 DIAGNOSIS — M1990 Unspecified osteoarthritis, unspecified site: Secondary | ICD-10-CM | POA: Insufficient documentation

## 2015-09-16 DIAGNOSIS — R102 Pelvic and perineal pain: Secondary | ICD-10-CM | POA: Diagnosis not present

## 2015-09-16 DIAGNOSIS — G8929 Other chronic pain: Secondary | ICD-10-CM | POA: Insufficient documentation

## 2015-09-16 DIAGNOSIS — G43909 Migraine, unspecified, not intractable, without status migrainosus: Secondary | ICD-10-CM | POA: Diagnosis not present

## 2015-09-16 DIAGNOSIS — Z6834 Body mass index (BMI) 34.0-34.9, adult: Secondary | ICD-10-CM | POA: Insufficient documentation

## 2015-09-16 DIAGNOSIS — R1011 Right upper quadrant pain: Secondary | ICD-10-CM

## 2015-09-16 DIAGNOSIS — R079 Chest pain, unspecified: Secondary | ICD-10-CM | POA: Insufficient documentation

## 2015-09-16 DIAGNOSIS — Z79899 Other long term (current) drug therapy: Secondary | ICD-10-CM

## 2015-09-16 DIAGNOSIS — Z9882 Breast implant status: Secondary | ICD-10-CM | POA: Diagnosis not present

## 2015-09-16 DIAGNOSIS — R109 Unspecified abdominal pain: Secondary | ICD-10-CM | POA: Insufficient documentation

## 2015-09-16 DIAGNOSIS — Z79891 Long term (current) use of opiate analgesic: Secondary | ICD-10-CM | POA: Diagnosis not present

## 2015-09-16 DIAGNOSIS — N949 Unspecified condition associated with female genital organs and menstrual cycle: Secondary | ICD-10-CM

## 2015-09-16 MED ORDER — METHADONE HCL 5 MG PO TABS: 5.0000 mg | ORAL_TABLET | Freq: Every day | ORAL | 0 refills | Status: AC

## 2015-09-16 MED ORDER — OXYCODONE HCL 5 MG PO TABS: 5.0000 mg | ORAL_TABLET | Freq: Every day | ORAL | 0 refills | Status: AC

## 2015-09-16 MED ORDER — METHADONE HCL 5 MG PO TABS: ORAL_TABLET | ORAL | 0 refills | Status: AC

## 2015-09-16 MED ORDER — OXYCODONE HCL 5 MG PO TABS: 5.0000 mg | ORAL_TABLET | ORAL | 0 refills | Status: AC

## 2015-09-16 MED ORDER — OXYCODONE HCL 5 MG PO TABS: 5.0000 mg | ORAL_TABLET | Freq: Two times a day (BID) | ORAL | 0 refills | Status: AC

## 2015-09-16 MED ORDER — OXYCODONE HCL 5 MG PO TABS: 5.0000 mg | ORAL_TABLET | Freq: Three times a day (TID) | ORAL | 0 refills | Status: AC

## 2015-09-16 MED ORDER — METHADONE HCL 5 MG PO TABS: 5.0000 mg | ORAL_TABLET | Freq: Two times a day (BID) | ORAL | 0 refills | Status: AC

## 2015-09-16 MED ORDER — METHADONE HCL 5 MG PO TABS: 5.0000 mg | ORAL_TABLET | ORAL | 0 refills | Status: AC

## 2015-09-16 MED ORDER — OXYCODONE HCL 5 MG PO TABS: 5.0000 mg | ORAL_TABLET | Freq: Four times a day (QID) | ORAL | 0 refills | Status: AC

## 2015-09-16 MED ORDER — METHADONE HCL 5 MG PO TABS: 5.0000 mg | ORAL_TABLET | Freq: Three times a day (TID) | ORAL | 0 refills | Status: AC

## 2015-09-16 MED ORDER — OXYCODONE HCL 5 MG PO TABS: ORAL_TABLET | ORAL | 0 refills | Status: AC

## 2015-09-16 MED ORDER — METHADONE HCL 5 MG PO TABS: 5.0000 mg | ORAL_TABLET | Freq: Four times a day (QID) | ORAL | 0 refills | Status: AC

## 2015-09-16 NOTE — Progress Notes (Signed)
Patient's Name: [name]  MRN: [id number]  Referring Provider: Franco Nones Flores*  DOB: 11/19/1975  PCP: Susan Schwab, FNP  DOS: 09/16/2015  Note by: [name]. Laban Emperor, MD  Service setting: Ambulatory outpatient  Specialty: Interventional Pain Management  Location: [facility] (AMB) Pain Management Facility    Patient type: Established   Primary Reason(s) for Visit: Encounter for prescription drug management (Level of risk: moderate) CC: Abdominal Pain (lower right side (flank) pain bladder,cystitis)   HPI  [name] is a 40 y.o. year old, female patient, who returns today as an established patient. She has Major depressive disorder, recurrent severe without psychotic features (HCC); Chronic pain; Anxiety state; Continuous opioid dependence (HCC); Cannot sleep; Affective disorder (HCC); Calculus of kidney; Long term current use of opiate analgesic; Long term prescription opiate use; Opiate use (380 MME/Day); Encounter for therapeutic drug level monitoring; Encounter for chronic pain management; Chronic low back pain (Location of Primary Source of Pain) (Bilateral) (R>L); Osteoarthrosis; Episodic mood disorder (HCC); Opioid dependence (HCC); Methadone dependence (HCC); Methadone use (HCC); History of methadone use (HCC); Pain due to interstitial cystitis; Chronic interstitial cystitis; Chronic pelvic pain (Location of Secondary source of pain) (suprapubic) (Bilateral) (R>L); Chronic hip pain (Right); Obesity, Class I, BMI 30-34.9 (68% higher incidence of chronic low back pain); History of migraine; Encounter for long-term (current) use of other high-risk medications; Chronic groin pain (Location of Tertiary source of pain) (Right); Chronic abdominal pain (Location of Primary Source of Pain) (RLQ: right lower quadrant) (Right); Chronic flank pain (Location of Secondary source of pain) (Right); Central chest pain; Flank pain; Ache in joint; Intractable abdominal pain; and Pain medication  agreement broken on her problem list.. Her primarily concern today is the Abdominal Pain (lower right side (flank) pain bladder,cystitis)  Pain Assessment: Self-Reported Pain Score: 7  (bladder) Clinically the patient looks like a 1/10 Reported level is inconsistent with clinical observations. Information on the proper use of the pain score provided to the patient today. Pain Type: Chronic pain Pain Location: Flank Pain Orientation: Right Pain Descriptors / Indicators: Aching, Constant, Burning, Stabbing Pain Frequency: Constant  The patient comes into the clinics today for pharmacological management of her chronic pain. I last saw this patient on 06/27/2015. The patient  reports that she does not use drugs. Her body mass index is 24.41 kg/m. Unfortunately, the patient's PMP has revealed that she has been getting pain medication from multiple physicians. Because of this, we will taper her medication down and completely stop it. Today I have provided her with a series of prescriptions that will last for 1 week each, in order to slowly taper her dose down and stop it without her having any significant withdrawals. In addition, the patient's last UDS was negative for the oxycodone and the methadone that I was prescribing. The fact that she had no methadone in the system was significant since methadone takes a long time to get out of the system. This means that she did not have any methadone for quite a while. This also means that she should not have any significant problems coming off of the opioids. Today were not discharging the patient from the clinic, we are simply changing the treatment plan to exclude any opioid analgesics. I have offered the patient some interventional techniques including some diagnostic celiac plexus blocks. She indicates that she has had blocks done by Dr. crisp in the past and they did not help and therefore she is not sure whether or not she wants to  pursue see with this route. I  pointed out that note all procedures and nerve blocks are the same and what we have planned for her is meant to assist us in determining where her pain is coming from so that a long-term treatment plan can be created for her.  Date of Last Visit: 06/27/15 Service Provided on Last Visit: Med Refill  Controlled Substance Pharmacotherapy Assessment & REMS (Risk Evaluation and Mitigation Strategy)  Analgesic: Methadone 10 mg every 6 hours (40 mg/day) plus oxycodone IR 10 mg every 6 hours (40 mg/day) MME/day: 380 mg/day Pill Count: Filled on 09/05/2015 and patient has empty bottle and states her medication is in pill box in the truck; methadone hcl 10mg   Empty bottle filled on 08/23/2015 and does not have the pills with her but are in pill box in the truck and states she has 4 days left in the box (32/120) count. Pharmacokinetics: Onset of action (Liberation/Absorption): Within expected pharmacological parameters Time to Peak effect (Distribution): Timing and results are as within normal expected parameters Duration of action (Metabolism/Excretion): Within normal limits for medication Pharmacodynamics: Analgesic Effect: More than 50% Activity Facilitation: Medication(s) allow patient to sit, stand, walk, and do the basic ADLs Perceived Effectiveness: Described as relatively effective, allowing for increase in activities of daily living (ADL) Side-effects or Adverse reactions: None reported Monitoring: [place] PMP: Online review of the past 6178-month period conducted. Unreported sources of opioid analgesics detected Last UDS on record: ToxAssure Select 13  Date Value Ref Range Status  06/27/2015 FINAL  Final    Comment:    ==================================================================== TOXASSURE SELECT 13 (MW) ==================================================================== Test                             Result       Flag       Units Drug Present not Declared for Prescription  Verification   Tramadol                       PRESENT      UNEXPECTED   O-Desmethyltramadol            PRESENT      UNEXPECTED   N-Desmethyltramadol            PRESENT      UNEXPECTED    Source of tramadol is a prescription medication.    O-desmethyltramadol and N-desmethyltramadol are expected    metabolites of tramadol. Drug Absent but Declared for Prescription Verification   Oxycodone                      Not Detected UNEXPECTED ng/mg creat   Methadone                      Not Detected UNEXPECTED ng/mg creat ==================================================================== Test                      Result    Flag   Units      Ref Range   Creatinine              178              mg/dL      >=16>=20 ==================================================================== Declared Medications:  The flagging and interpretation on this report are based on the  following declared medications.  Unexpected results may arise from  inaccuracies in the declared medications.  **Note:  The testing scope of this panel includes these medications:  Methadone (Dolophine)  Oxycodone  **Note: The testing scope of this panel does not include following  reported medications:  Buspirone (BuSpar)  Clonidine (Catapres)  Naloxone  Ondansetron (Zofran)  Quetiapine (Seroquel) ==================================================================== For clinical consultation, please call [phone]. ====================================================================    UDS interpretation: Non-Compliant None of the prescribed medications were found on the sample. In addition she was found to be taking tramadol. Today we will terminate opiate therapy. Medication Assessment Form: Reviewed. Patient indicates being compliant with therapy Treatment compliance: Non-compliant Risk Assessment: Aberrant Behavior: Obtaining medications from illicit sources, non-compliance with medical instructions on the proper use of  the medication, unsanctioned dose escalation, unsafe use of medication, diminised ability to recongnize a problem with one's behavior or  use of the medication, continued use despite claims of ineffective analgesia, inability to consider abstinence, obtaining controlled sustances from inappropriate sources, doctor shopping, claims that "nothing else works", resistance to changing therapy, missing appointments for alternative therapies, but not those for medication refills, extensive time discussing medication  and non-adherence to medication regimen Substance Use Disorder (SUD) Risk Level: Very High Risk of opioid abuse or dependence: 0.7-3.0% with doses ? 36 MME/day and 6.1-26% with doses ? 120 MME/day. Opioid Risk Tool (ORT) Score: Total Score: 1 Low Risk for SUD (Score <3) Depression Scale Score: PHQ-2: PHQ-2 Total Score: 0 No depression (0) PHQ-9: PHQ-9 Total Score: 0 No depression (0-4)  Pharmacologic Plan: Discontinue opioid therapy.  Laboratory Chemistry  Inflammation Markers Lab Results  Component Value Date   ESRSEDRATE 7 02/25/2015   CRP <0.5 02/25/2015    Renal Function Lab Results  Component Value Date   BUN 6 08/20/2015   CREATININE 0.87 08/20/2015   GFRAA >60 08/20/2015   GFRNONAA >60 08/20/2015    Hepatic Function Lab Results  Component Value Date   AST 25 08/20/2015   ALT 28 08/20/2015   ALBUMIN 4.5 08/20/2015    Electrolytes Lab Results  Component Value Date   NA 139 08/20/2015   K 3.6 08/20/2015   CL 104 08/20/2015   CALCIUM 10.4 (H) 08/20/2015   MG 2.2 02/25/2015    Pain Modulating Vitamins No results found for: Jerry Caras [id number], [id number], [id number], 25OHVITD1, 25OHVITD2, 25OHVITD3, VITAMINB12  Coagulation Parameters Lab Results  Component Value Date   PLT 340 08/20/2015    Cardiovascular Lab Results  Component Value Date   HGB 14.4 08/20/2015   HCT 41.6 08/20/2015   Note: Lab results reviewed.  Recent Diagnostic Imaging  Dg Chest  2 View Result Date: 08/20/2015 CLINICAL DATA:  Three days of right-sided flank pain extending to the rib area. Recent left ureteral stent placement, removed proximal 1 week ago. EXAM: CHEST  2 VIEW COMPARISON:  CT of the abdomen and pelvis 08/13/2015. FINDINGS: The heart size and mediastinal contours are within normal limits. Both lungs are clear. The visualized skeletal structures are unremarkable. IMPRESSION: Negative two view chest x-ray Electronically Signed   By: Marin Roberts M.D.   On: 08/20/2015 18:47   Ct Angio Chest Pe W Or Wo Contrast Result Date: 08/20/2015 CLINICAL DATA:  Acute onset of right upper quadrant abdominal pain and right lower chest pain. Right flank pain. Initial encounter. EXAM: CT ANGIOGRAPHY CHEST WITH CONTRAST TECHNIQUE: Multidetector CT imaging of the chest was performed using the standard protocol during bolus administration of intravenous contrast. Multiplanar CT image reconstructions and MIPs were obtained to evaluate the vascular anatomy. CONTRAST:  75 mL of Isovue 370 IV contrast  COMPARISON:  Chest radiograph performed earlier today at 6:23 p.m. FINDINGS: There is no evidence of pulmonary embolus. The lungs are clear bilaterally. There is no evidence of significant focal consolidation, pleural effusion or pneumothorax. No masses are identified; no abnormal focal contrast enhancement is seen. The mediastinum is unremarkable in appearance. No mediastinal lymphadenopathy is seen. No pericardial effusion is identified. The great vessels are grossly unremarkable in appearance. No axillary lymphadenopathy is seen. The visualized portions of the thyroid gland are unremarkable in appearance. The patient's bilateral breast implants are grossly unremarkable in appearance. The visualized portions of the liver and spleen are unremarkable. The visualized portions of the pancreas, gallbladder, stomach, adrenal glands and left kidney are within normal limits. No acute osseous  abnormalities are seen. Review of the MIP images confirms the above findings. IMPRESSION: 1. No evidence of pulmonary embolus. 2. Lungs clear bilaterally. Electronically Signed   By: Roanna Raider M.D.   On: 08/20/2015 20:17   Ct Renal Stone Study Result Date: 08/20/2015 CLINICAL DATA:  New onset pain in the right upper abdomen radiating to the right back for 3 days. Fatigue. Recent ureteral stenting for a kidney stone. EXAM: CT ABDOMEN AND PELVIS WITHOUT CONTRAST TECHNIQUE: Multidetector CT imaging of the abdomen and pelvis was performed following the standard protocol without IV contrast. COMPARISON:  08/13/2015 FINDINGS: The lung bases are clear.  Bilateral breast implants. There is residual contrast material in the renal collecting systems, ureters, and bladder are from previous CT chest done earlier today. There is no evidence of hydronephrosis or ureteral obstruction. A nonobstructing stone could be obscured by the contrast material. The unenhanced appearance of the liver, spleen, gallbladder, pancreas, adrenal glands, abdominal aorta, inferior vena cava, and retroperitoneal lymph nodes is unremarkable. Stomach, small bowel, and colon are not abnormally distended. No free air or free fluid in the abdomen. Pelvis: Surgical absence of the appendix. Bladder wall is not thickened. No free or loculated pelvic fluid collections. No pelvic mass or lymphadenopathy. No destructive bone lesions. IMPRESSION: Residual contrast material limits evaluation of the urinary tract. There is no evidence of hydronephrosis or ureteral obstruction. Electronically Signed   By: Burman Nieves M.D.   On: 08/20/2015 23:24   US Abdomen Limited Ruq Result Date: 08/20/2015 CLINICAL DATA:  Right upper quadrant abdominal pain. EXAM: US ABDOMEN LIMITED - RIGHT UPPER QUADRANT COMPARISON:  CT of the abdomen 08/13/2015 FINDINGS: Gallbladder: No gallstones or wall thickening visualized. No sonographic Murphy sign noted by sonographer.  Maximal wall thickness is 2.6 mm, within normal limits. The gallbladder is somewhat under distended. Common bile duct: Diameter: 4.7 mm, within normal limits Liver: No focal lesion identified. Within normal limits in parenchymal echogenicity. IMPRESSION: Negative right upper quadrant ultrasound. Electronically Signed   By: Marin Roberts M.D.   On: 08/20/2015 18:43    Meds  The patient has a current medication list which includes the following prescription(s): buspirone, clonidine, methadone, methadone, methadone, methadone, methadone, methadone, methadone, methadone, ondansetron, oxycodone, oxycodone, oxycodone, oxycodone, oxycodone, oxycodone, oxycodone, oxycodone, and quetiapine.  Current Outpatient Prescriptions on File Prior to Visit  Medication Sig  . busPIRone (BUSPAR) 5 MG tablet Take 5 mg by mouth 3 (three) times daily.  . cloNIDine (CATAPRES) 0.1 MG tablet Take 0.1 mg by mouth 3 (three) times daily.  . ondansetron (ZOFRAN ODT) 4 MG disintegrating tablet Take 1 tablet (4 mg total) by mouth every 8 (eight) hours as needed for nausea or vomiting.  Marland Kitchen QUEtiapine (SEROQUEL) 100 MG tablet Take 100 mg  by mouth at bedtime.   No current facility-administered medications on file prior to visit.     ROS  Constitutional: Denies any fever or chills Gastrointestinal: No reported hemesis, hematochezia, vomiting, or acute GI distress Musculoskeletal: Denies any acute onset joint swelling, redness, loss of ROM, or weakness Neurological: No reported episodes of acute onset apraxia, aphasia, dysarthria, agnosia, amnesia, paralysis, loss of coordination, or loss of consciousness  Allergies  Ms. Streetman is allergic to codeine; butorphanol tartrate; ketorolac; promethazine; sumatriptan succinate; topamax [topiramate]; toradol [ketorolac tromethamine]; tramadol; and sulfa antibiotics.  PFSH  Medical:  Ms. Bruni  has a past medical history of Ache in joint (02/04/2013); Addiction, opium (HCC)  (01/28/2012); Anemia; Complication of anesthesia; Endometriosis; Family history of adverse reaction to anesthesia; Heart murmur; History of methicillin resistant staphylococcus aureus (MRSA) (2012); Hypertension; Hypothyroidism; Interstitial cystitis; Kidney stones; LBP (low back pain) (05/23/2013); PONV (postoperative nausea and vomiting); and TIA (transient ischemic attack) (2007). Family: family history is not on file. Surgical:  has a past surgical history that includes Appendectomy; Abdominal hysterectomy; Cesarean section; carpal tunnel; Breast enhancement surgery; Diagnostic laparoscopy; Carpal tunnel release (Left, 01/28/2015); Lithotripsy; Ureteroscopy with holmium laser lithotripsy (Left, 07/31/2015); and Cystoscopy with stent placement (Left, 07/31/2015). Tobacco:  reports that she has never smoked. She has never used smokeless tobacco. Alcohol:  reports that she does not drink alcohol. Drug:  reports that she does not use drugs.  Constitutional Exam  General appearance: Well nourished, well developed, and well hydrated. In no acute distress Vitals:   09/16/15 0819  BP: 97/75  Pulse: (!) 107  Resp: 16  Temp: 97.9 F (36.6 C)  TempSrc: Oral  SpO2: 100%  Weight: 125 lb (56.7 kg)  Height: 5' (1.524 m)  BMI Assessment: Estimated body mass index is 24.41 kg/m as calculated from the following:   Height as of this encounter: 5' (1.524 m).   Weight as of this encounter: 125 lb (56.7 kg).   BMI interpretation: (18.5-24.9 kg/m2) = Ideal body weight BMI Readings from Last 4 Encounters:  09/16/15 24.41 kg/m  08/21/15 25.53 kg/m  08/13/15 24.41 kg/m  08/09/15 25.12 kg/m   Wt Readings from Last 4 Encounters:  09/16/15 125 lb (56.7 kg)  08/21/15 126 lb 6.4 oz (57.3 kg)  08/13/15 125 lb (56.7 kg)  08/09/15 128 lb 9.6 oz (58.3 kg)  Psych/Mental status: Alert and oriented x 3 (person, place, & time) Eyes: PERLA Respiratory: No evidence of acute respiratory distress  Cervical Spine  Exam  Inspection: No masses, redness, or swelling Alignment: Symmetrical Functional ROM: ROM appears unrestricted Stability: No instability detected Muscle strength & Tone: Functionally intact Sensory: Unimpaired Palpation: Non-contributory  Upper Extremity (UE) Exam    Side: Right upper extremity  Side: Left upper extremity  Inspection: No masses, redness, swelling, or asymmetry  Inspection: No masses, redness, swelling, or asymmetry  Functional ROM: ROM appears unrestricted          Functional ROM: ROM appears unrestricted          Muscle strength & Tone: Functionally intact  Muscle strength & Tone: Functionally intact  Sensory: Unimpaired  Sensory: Unimpaired  Palpation: Non-contributory  Palpation: Non-contributory   Thoracic Spine Exam  Inspection: No masses, redness, or swelling Alignment: Symmetrical Functional ROM: ROM appears unrestricted Stability: No instability detected Sensory: Unimpaired Muscle strength & Tone: Functionally intact Palpation: Non-contributory  Lumbar Spine Exam  Inspection: No masses, redness, or swelling Alignment: Symmetrical Functional ROM: ROM appears unrestricted Stability: No instability detected Muscle strength & Tone:  Functionally intact Sensory: Unimpaired Palpation: Non-contributory Provocative Tests: Lumbar Hyperextension and rotation test: evaluation deferred today       Patrick's Maneuver: evaluation deferred today              Gait & Posture Assessment  Ambulation: Unassisted Gait: Relatively normal for age and body habitus Posture: WNL   Lower Extremity Exam    Side: Right lower extremity  Side: Left lower extremity  Inspection: No masses, redness, swelling, or asymmetry  Inspection: No masses, redness, swelling, or asymmetry  Functional ROM: ROM appears unrestricted          Functional ROM: ROM appears unrestricted          Muscle strength & Tone: Functionally intact  Muscle strength & Tone: Functionally intact  Sensory:  Unimpaired  Sensory: Unimpaired  Palpation: Non-contributory  Palpation: Non-contributory    Assessment & Plan  Primary Diagnosis & Pertinent Problem List: The primary encounter diagnosis was Chronic pain. Diagnoses of Long term current use of opiate analgesic, Methadone use (HCC), Opiate use (380 MME/Day), Chronic abdominal pain (Location of Primary Source of Pain) (RLQ: right lower quadrant) (Right), Chronic low back pain (Location of Primary Source of Pain) (Bilateral) (R>L), Chronic flank pain (Location of Secondary source of pain) (Right), Chronic pelvic pain (Location of Secondary source of pain) (suprapubic) (Bilateral) (R>L), Chronic groin pain, unspecified laterality, and Pain medication agreement broken were also pertinent to this visit.  Visit Diagnosis: 1. Chronic pain   2. Long term current use of opiate analgesic   3. Methadone use (HCC)   4. Opiate use (380 MME/Day)   5. Chronic abdominal pain (Location of Primary Source of Pain) (RLQ: right lower quadrant) (Right)   6. Chronic low back pain (Location of Primary Source of Pain) (Bilateral) (R>L)   7. Chronic flank pain (Location of Secondary source of pain) (Right)   8. Chronic pelvic pain (Location of Secondary source of pain) (suprapubic) (Bilateral) (R>L)   9. Chronic groin pain, unspecified laterality   10. Pain medication agreement broken     Problems updated and reviewed during this visit: Problem  Pain Medication Agreement Broken   09/16/2015  [facility] Prescription Monitoring Program Database revealed that the patient has been obtaining pain medications from other physicians.     Problem-specific Plan(s): No problem-specific Assessment & Plan notes found for this encounter.  No new Assessment & Plan notes have been filed under this hospital service since the last note was generated. Service: Pain Management   Plan of Care   Problem List Items Addressed This Visit      High   Chronic abdominal pain  (Location of Primary Source of Pain) (RLQ: right lower quadrant) (Right) (Chronic)   Relevant Orders   CELIAC PLEXUS BLOCK   Chronic flank pain (Location of Secondary source of pain) (Right) (Chronic)   Chronic groin pain (Location of Tertiary source of pain) (Right) (Chronic)   Relevant Medications   methadone (DOLOPHINE) 5 MG tablet (Start on 09/25/2015)   methadone (DOLOPHINE) 5 MG tablet (Start on 10/02/2015)   methadone (DOLOPHINE) 5 MG tablet (Start on 10/09/2015)   methadone (DOLOPHINE) 5 MG tablet (Start on 10/16/2015)   methadone (DOLOPHINE) 5 MG tablet (Start on 10/23/2015)   methadone (DOLOPHINE) 5 MG tablet (Start on 10/30/2015)   methadone (DOLOPHINE) 5 MG tablet (Start on 11/06/2015)   methadone (DOLOPHINE) 5 MG tablet (Start on 11/13/2015)   oxyCODONE (OXY IR/ROXICODONE) 5 MG immediate release tablet (Start on 09/25/2015)   oxyCODONE (OXY  IR/ROXICODONE) 5 MG immediate release tablet (Start on 10/02/2015)   oxyCODONE (OXY IR/ROXICODONE) 5 MG immediate release tablet (Start on 10/09/2015)   oxyCODONE (OXY IR/ROXICODONE) 5 MG immediate release tablet (Start on 10/16/2015)   oxyCODONE (OXY IR/ROXICODONE) 5 MG immediate release tablet (Start on 10/23/2015)   oxyCODONE (OXY IR/ROXICODONE) 5 MG immediate release tablet (Start on 10/30/2015)   oxyCODONE (OXY IR/ROXICODONE) 5 MG immediate release tablet (Start on 11/06/2015)   oxyCODONE (OXY IR/ROXICODONE) 5 MG immediate release tablet (Start on 11/13/2015)   Chronic low back pain (Location of Primary Source of Pain) (Bilateral) (R>L) (Chronic)   Relevant Medications   methadone (DOLOPHINE) 5 MG tablet (Start on 09/25/2015)   methadone (DOLOPHINE) 5 MG tablet (Start on 10/02/2015)   methadone (DOLOPHINE) 5 MG tablet (Start on 10/09/2015)   methadone (DOLOPHINE) 5 MG tablet (Start on 10/16/2015)   methadone (DOLOPHINE) 5 MG tablet (Start on 10/23/2015)   methadone (DOLOPHINE) 5 MG tablet (Start on 10/30/2015)   methadone (DOLOPHINE) 5 MG tablet  (Start on 11/06/2015)   methadone (DOLOPHINE) 5 MG tablet (Start on 11/13/2015)   oxyCODONE (OXY IR/ROXICODONE) 5 MG immediate release tablet (Start on 09/25/2015)   oxyCODONE (OXY IR/ROXICODONE) 5 MG immediate release tablet (Start on 10/02/2015)   oxyCODONE (OXY IR/ROXICODONE) 5 MG immediate release tablet (Start on 10/09/2015)   oxyCODONE (OXY IR/ROXICODONE) 5 MG immediate release tablet (Start on 10/16/2015)   oxyCODONE (OXY IR/ROXICODONE) 5 MG immediate release tablet (Start on 10/23/2015)   oxyCODONE (OXY IR/ROXICODONE) 5 MG immediate release tablet (Start on 10/30/2015)   oxyCODONE (OXY IR/ROXICODONE) 5 MG immediate release tablet (Start on 11/06/2015)   oxyCODONE (OXY IR/ROXICODONE) 5 MG immediate release tablet (Start on 11/13/2015)   Chronic pain - Primary (Chronic)   Relevant Medications   methadone (DOLOPHINE) 5 MG tablet (Start on 09/25/2015)   methadone (DOLOPHINE) 5 MG tablet (Start on 10/02/2015)   methadone (DOLOPHINE) 5 MG tablet (Start on 10/09/2015)   methadone (DOLOPHINE) 5 MG tablet (Start on 10/16/2015)   methadone (DOLOPHINE) 5 MG tablet (Start on 10/23/2015)   methadone (DOLOPHINE) 5 MG tablet (Start on 10/30/2015)   methadone (DOLOPHINE) 5 MG tablet (Start on 11/06/2015)   methadone (DOLOPHINE) 5 MG tablet (Start on 11/13/2015)   oxyCODONE (OXY IR/ROXICODONE) 5 MG immediate release tablet (Start on 09/25/2015)   oxyCODONE (OXY IR/ROXICODONE) 5 MG immediate release tablet (Start on 10/02/2015)   oxyCODONE (OXY IR/ROXICODONE) 5 MG immediate release tablet (Start on 10/09/2015)   oxyCODONE (OXY IR/ROXICODONE) 5 MG immediate release tablet (Start on 10/16/2015)   oxyCODONE (OXY IR/ROXICODONE) 5 MG immediate release tablet (Start on 10/23/2015)   oxyCODONE (OXY IR/ROXICODONE) 5 MG immediate release tablet (Start on 10/30/2015)   oxyCODONE (OXY IR/ROXICODONE) 5 MG immediate release tablet (Start on 11/06/2015)   oxyCODONE (OXY IR/ROXICODONE) 5 MG immediate release tablet (Start on  11/13/2015)   Chronic pelvic pain (Location of Secondary source of pain) (suprapubic) (Bilateral) (R>L) (Chronic)   Relevant Medications   methadone (DOLOPHINE) 5 MG tablet (Start on 09/25/2015)   methadone (DOLOPHINE) 5 MG tablet (Start on 10/02/2015)   methadone (DOLOPHINE) 5 MG tablet (Start on 10/09/2015)   methadone (DOLOPHINE) 5 MG tablet (Start on 10/16/2015)   methadone (DOLOPHINE) 5 MG tablet (Start on 10/23/2015)   methadone (DOLOPHINE) 5 MG tablet (Start on 10/30/2015)   methadone (DOLOPHINE) 5 MG tablet (Start on 11/06/2015)   methadone (DOLOPHINE) 5 MG tablet (Start on 11/13/2015)   oxyCODONE (OXY IR/ROXICODONE) 5 MG immediate release tablet (Start on 09/25/2015)  oxyCODONE (OXY IR/ROXICODONE) 5 MG immediate release tablet (Start on 10/02/2015)   oxyCODONE (OXY IR/ROXICODONE) 5 MG immediate release tablet (Start on 10/09/2015)   oxyCODONE (OXY IR/ROXICODONE) 5 MG immediate release tablet (Start on 10/16/2015)   oxyCODONE (OXY IR/ROXICODONE) 5 MG immediate release tablet (Start on 10/23/2015)   oxyCODONE (OXY IR/ROXICODONE) 5 MG immediate release tablet (Start on 10/30/2015)   oxyCODONE (OXY IR/ROXICODONE) 5 MG immediate release tablet (Start on 11/06/2015)   oxyCODONE (OXY IR/ROXICODONE) 5 MG immediate release tablet (Start on 11/13/2015)     Medium   Long term current use of opiate analgesic (Chronic)   Relevant Orders   ToxASSURE Select 13 (MW), Urine   Methadone use (HCC) (Chronic)   Relevant Medications   methadone (DOLOPHINE) 5 MG tablet (Start on 09/25/2015)   methadone (DOLOPHINE) 5 MG tablet (Start on 10/02/2015)   methadone (DOLOPHINE) 5 MG tablet (Start on 10/09/2015)   methadone (DOLOPHINE) 5 MG tablet (Start on 10/16/2015)   methadone (DOLOPHINE) 5 MG tablet (Start on 10/23/2015)   methadone (DOLOPHINE) 5 MG tablet (Start on 10/30/2015)   methadone (DOLOPHINE) 5 MG tablet (Start on 11/06/2015)   methadone (DOLOPHINE) 5 MG tablet (Start on 11/13/2015)   Opiate use (380  MME/Day) (Chronic)   Pain medication agreement broken    Other Visit Diagnoses   None.      Pharmacotherapy (Medications Ordered): Meds ordered this encounter  Medications  . methadone (DOLOPHINE) 5 MG tablet    Sig: Take 2 tabs PO 3 times a day and 1 tab PO at HS x 7 days    Dispense:  49 tablet    Refill:  0    Do not place this medication, or any other prescription from our practice, on "Automatic Refill". Patient may have prescription filled one day early if pharmacy is closed on scheduled refill date. Do not fill until: 09/25/15 To last until: 10/02/15  . methadone (DOLOPHINE) 5 MG tablet    Sig: Take 1 tablet (5 mg total) by mouth 6 (six) times daily.    Dispense:  42 tablet    Refill:  0    Do not place this medication, or any other prescription from our practice, on "Automatic Refill". Patient may have prescription filled one day early if pharmacy is closed on scheduled refill date. Do not fill until: 10/02/15 To last until: 10/09/15  . methadone (DOLOPHINE) 5 MG tablet    Sig: Take 1 tablet (5 mg total) by mouth 5 (five) times daily.    Dispense:  35 tablet    Refill:  0    Do not place this medication, or any other prescription from our practice, on "Automatic Refill". Patient may have prescription filled one day early if pharmacy is closed on scheduled refill date. Do not fill until: 10/09/15 To last until: 10/16/15  . methadone (DOLOPHINE) 5 MG tablet    Sig: Take 1 tablet (5 mg total) by mouth 4 (four) times daily.    Dispense:  28 tablet    Refill:  0    Do not place this medication, or any other prescription from our practice, on "Automatic Refill". Patient may have prescription filled one day early if pharmacy is closed on scheduled refill date. Do not fill until: 10/16/15 To last until: 10/23/15  . methadone (DOLOPHINE) 5 MG tablet    Sig: Take 1 tablet (5 mg total) by mouth 3 (three) times daily.    Dispense:  21 tablet    Refill:  0  Do not place this  medication, or any other prescription from our practice, on "Automatic Refill". Patient may have prescription filled one day early if pharmacy is closed on scheduled refill date. Do not fill until: 10/23/15 To last until: 10/30/15  . methadone (DOLOPHINE) 5 MG tablet    Sig: Take 1 tablet (5 mg total) by mouth 2 (two) times daily.    Dispense:  14 tablet    Refill:  0    Do not place this medication, or any other prescription from our practice, on "Automatic Refill". Patient may have prescription filled one day early if pharmacy is closed on scheduled refill date. Do not fill until: 10/30/15 To last until: 11/06/15  . methadone (DOLOPHINE) 5 MG tablet    Sig: Take 1 tablet (5 mg total) by mouth daily.    Dispense:  7 tablet    Refill:  0    Do not place this medication, or any other prescription from our practice, on "Automatic Refill". Patient may have prescription filled one day early if pharmacy is closed on scheduled refill date. Do not fill until: 11/06/15 To last until: 11/13/15  . methadone (DOLOPHINE) 5 MG tablet    Sig: Take 1 tablet (5 mg total) by mouth every other day.    Dispense:  3 tablet    Refill:  0    Do not place this medication, or any other prescription from our practice, on "Automatic Refill". Patient may have prescription filled one day early if pharmacy is closed on scheduled refill date. Do not fill until: 11/13/15 To last until: 11/20/15  . oxyCODONE (OXY IR/ROXICODONE) 5 MG immediate release tablet    Sig: Take 2 tabs PO 3 times a day and 1 tab PO at HS x 7 days    Dispense:  49 tablet    Refill:  0    Do not place this medication, or any other prescription from our practice, on "Automatic Refill". Patient may have prescription filled one day early if pharmacy is closed on scheduled refill date. Do not fill until: 09/25/15 To last until: 10/02/15  . oxyCODONE (OXY IR/ROXICODONE) 5 MG immediate release tablet    Sig: Take 1 tablet (5 mg total) by mouth 6  (six) times daily.    Dispense:  42 tablet    Refill:  0    Do not place this medication, or any other prescription from our practice, on "Automatic Refill". Patient may have prescription filled one day early if pharmacy is closed on scheduled refill date. Do not fill until: 10/02/15 To last until: 10/09/15  . oxyCODONE (OXY IR/ROXICODONE) 5 MG immediate release tablet    Sig: Take 1 tablet (5 mg total) by mouth 5 (five) times daily.    Dispense:  35 tablet    Refill:  0    Do not place this medication, or any other prescription from our practice, on "Automatic Refill". Patient may have prescription filled one day early if pharmacy is closed on scheduled refill date. Do not fill until: 10/09/15 To last until: 10/16/15  . oxyCODONE (OXY IR/ROXICODONE) 5 MG immediate release tablet    Sig: Take 1 tablet (5 mg total) by mouth 4 (four) times daily.    Dispense:  28 tablet    Refill:  0    Do not place this medication, or any other prescription from our practice, on "Automatic Refill". Patient may have prescription filled one day early if pharmacy is closed on scheduled refill date. Do  not fill until: 10/16/15 To last until: 10/23/15  . oxyCODONE (OXY IR/ROXICODONE) 5 MG immediate release tablet    Sig: Take 1 tablet (5 mg total) by mouth 3 (three) times daily.    Dispense:  21 tablet    Refill:  0    Do not place this medication, or any other prescription from our practice, on "Automatic Refill". Patient may have prescription filled one day early if pharmacy is closed on scheduled refill date. Do not fill until: 10/23/15 To last until: 10/30/15  . oxyCODONE (OXY IR/ROXICODONE) 5 MG immediate release tablet    Sig: Take 1 tablet (5 mg total) by mouth 2 (two) times daily.    Dispense:  14 tablet    Refill:  0    Do not place this medication, or any other prescription from our practice, on "Automatic Refill". Patient may have prescription filled one day early if pharmacy is closed on  scheduled refill date. Do not fill until: 10/30/15 To last until: 11/06/15  . oxyCODONE (OXY IR/ROXICODONE) 5 MG immediate release tablet    Sig: Take 1 tablet (5 mg total) by mouth daily.    Dispense:  7 tablet    Refill:  0    Do not place this medication, or any other prescription from our practice, on "Automatic Refill". Patient may have prescription filled one day early if pharmacy is closed on scheduled refill date. Do not fill until: 11/06/15 To last until: 11/13/15  . oxyCODONE (OXY IR/ROXICODONE) 5 MG immediate release tablet    Sig: Take 1 tablet (5 mg total) by mouth every other day.    Dispense:  3 tablet    Refill:  0    Do not place this medication, or any other prescription from our practice, on "Automatic Refill". Patient may have prescription filled one day early if pharmacy is closed on scheduled refill date. Do not fill until: 11/13/15 To last until: 11/20/15    [facility] & Procedure Ordered: Orders Placed This Encounter  Procedures  . CELIAC PLEXUS BLOCK  . ToxASSURE Select 13 (MW), Urine    Imaging Ordered: None  Interventional Therapies: Scheduled:  Diagnostic bilateral celiac plexus block under fluoroscopic guidance and IV sedation.    Considering:   Diagnostic bilateral celiac plexus block under fluoroscopic guidance and IV sedation.  Due to the patient's misuse and abuse of the opioid analgesics, we will be terminating this therapy period this has been done by tapering her off of both, then methadone and the oxycodone, over a period of several weeks. We will not be going back on any of these. The pattern observed was that of medication misuse.    PRN Procedures:  None at this time.    Referral(s) or Consult(s): None at this time.  New Prescriptions   METHADONE (DOLOPHINE) 5 MG TABLET    Take 2 tabs PO 3 times a day and 1 tab PO at HS x 7 days   METHADONE (DOLOPHINE) 5 MG TABLET    Take 1 tablet (5 mg total) by mouth 6 (six) times daily.   METHADONE  (DOLOPHINE) 5 MG TABLET    Take 1 tablet (5 mg total) by mouth 5 (five) times daily.   METHADONE (DOLOPHINE) 5 MG TABLET    Take 1 tablet (5 mg total) by mouth 4 (four) times daily.   METHADONE (DOLOPHINE) 5 MG TABLET    Take 1 tablet (5 mg total) by mouth 3 (three) times daily.   METHADONE (DOLOPHINE) 5 MG  TABLET    Take 1 tablet (5 mg total) by mouth 2 (two) times daily.   METHADONE (DOLOPHINE) 5 MG TABLET    Take 1 tablet (5 mg total) by mouth daily.   METHADONE (DOLOPHINE) 5 MG TABLET    Take 1 tablet (5 mg total) by mouth every other day.   OXYCODONE (OXY IR/ROXICODONE) 5 MG IMMEDIATE RELEASE TABLET    Take 2 tabs PO 3 times a day and 1 tab PO at HS x 7 days   OXYCODONE (OXY IR/ROXICODONE) 5 MG IMMEDIATE RELEASE TABLET    Take 1 tablet (5 mg total) by mouth 6 (six) times daily.   OXYCODONE (OXY IR/ROXICODONE) 5 MG IMMEDIATE RELEASE TABLET    Take 1 tablet (5 mg total) by mouth 5 (five) times daily.   OXYCODONE (OXY IR/ROXICODONE) 5 MG IMMEDIATE RELEASE TABLET    Take 1 tablet (5 mg total) by mouth 4 (four) times daily.   OXYCODONE (OXY IR/ROXICODONE) 5 MG IMMEDIATE RELEASE TABLET    Take 1 tablet (5 mg total) by mouth 3 (three) times daily.   OXYCODONE (OXY IR/ROXICODONE) 5 MG IMMEDIATE RELEASE TABLET    Take 1 tablet (5 mg total) by mouth 2 (two) times daily.   OXYCODONE (OXY IR/ROXICODONE) 5 MG IMMEDIATE RELEASE TABLET    Take 1 tablet (5 mg total) by mouth daily.   OXYCODONE (OXY IR/ROXICODONE) 5 MG IMMEDIATE RELEASE TABLET    Take 1 tablet (5 mg total) by mouth every other day.    Medications administered during this visit: Ms. Worthey had no medications administered during this visit.  Requested PM Follow-up: Return for Schedule Procedure, (ASAA).  No future appointments.  Primary Care Physician: Susan Schwab, FNP Location: [facility] Outpatient Pain Management Facility Note by: [name]. Laban Emperor, M.D, DABA, DABAPM, DABPM, DABIPP, FIPP  Pain Score Disclaimer: We use the  NRS-11 scale. This is a self-reported, subjective measurement of pain severity with only modest accuracy. It is used primarily to identify changes within a particular patient. It must be understood that outpatient pain scales are significantly less accurate that those used for research, where they can be applied under ideal controlled circumstances with minimal exposure to variables. In reality, the score is likely to be a combination of pain intensity and pain affect, where pain affect describes the degree of emotional arousal or changes in action readiness caused by the sensory experience of pain. Factors such as social and work situation, setting, emotional state, anxiety levels, expectation, and prior pain experience may influence pain perception and show large inter-individual differences that may also be affected by time variables.  Patient instructions provided during this appointment: There are no Patient Instructions on file for this visit.

## 2015-09-16 NOTE — Patient Instructions (Signed)

## 2015-09-16 NOTE — Progress Notes (Signed)
Patient did not want to schedule procedure at this time.

## 2015-09-16 NOTE — Progress Notes (Signed)
Safety precautions to be maintained throughout the outpatient stay will include: orient to surroundings, keep bed in low position, maintain call bell within reach at all times, provide assistance with transfer out of bed and ambulation. Pill count oxycodone hcl 10 mg  Filled on 09/05/2015 and patient has empty bottle and states her medication is in pill box in the truck; methadone hcl 10mg   Empty bottle filled on 08/23/2015 and does not have the pills with her but are in pill box in the truck and states she has 4 days left in the box (32/120) count.

## 2015-09-16 NOTE — Progress Notes (Signed)
Patient has been to the hospital four times since the last office visit here. Had kidney stones, stent and surgery to remove kidney stones.

## 2015-09-23 LAB — TOXASSURE SELECT 13 (MW), URINE

## 2015-09-24 NOTE — Telephone Encounter (Signed)
Error

## 2015-10-03 NOTE — Progress Notes (Signed)
NOTE: This forensic urine drug screen (UDS) test was conducted using a state-of-the-art ultra high performance liquid chromatography and mass spectrometry system (UPLC/MS-MS), the most sophisticated and accurate method available. UPLC/MS-MS is 1,000 times more precise and accurate than standard gas chromatography and mass spectrometry (GC/MS). This system can analyze 26 drug categories and 180 drug compounds.  An unreported benzodiazepine was detected in the sample. CDC reports have identified the combination of opioids and benzodiazepines (Valium, Ativan, Xanax, Librium, Tranxene, Klonopin, Dalmane, Halcion,Restoril, etc.) to be associated in a significant number of reported drug-to-drug interactions leading to accidental overdosing leading to respiratory failure and death.

## 2015-10-12 ENCOUNTER — Emergency Department: Payer: BLUE CROSS/BLUE SHIELD

## 2015-10-12 ENCOUNTER — Encounter: Payer: Self-pay | Admitting: Emergency Medicine

## 2015-10-12 ENCOUNTER — Emergency Department
Admission: EM | Admit: 2015-10-12 | Discharge: 2015-10-12 | Disposition: A | Discharge status: Home / Self Care | Payer: BLUE CROSS/BLUE SHIELD | Attending: Student in an Organized Health Care Education/Training Program | Admitting: Student in an Organized Health Care Education/Training Program

## 2015-10-12 DIAGNOSIS — Z79899 Other long term (current) drug therapy: Secondary | ICD-10-CM | POA: Diagnosis not present

## 2015-10-12 DIAGNOSIS — Y929 Unspecified place or not applicable: Secondary | ICD-10-CM | POA: Diagnosis not present

## 2015-10-12 DIAGNOSIS — Y999 Unspecified external cause status: Secondary | ICD-10-CM | POA: Insufficient documentation

## 2015-10-12 DIAGNOSIS — I10 Essential (primary) hypertension: Secondary | ICD-10-CM | POA: Insufficient documentation

## 2015-10-12 DIAGNOSIS — E039 Hypothyroidism, unspecified: Secondary | ICD-10-CM | POA: Diagnosis not present

## 2015-10-12 DIAGNOSIS — M25561 Pain in right knee: Secondary | ICD-10-CM | POA: Insufficient documentation

## 2015-10-12 DIAGNOSIS — W010XXA Fall on same level from slipping, tripping and stumbling without subsequent striking against object, initial encounter: Secondary | ICD-10-CM | POA: Insufficient documentation

## 2015-10-12 DIAGNOSIS — Y939 Activity, unspecified: Secondary | ICD-10-CM | POA: Insufficient documentation

## 2015-10-12 MED ORDER — ETODOLAC 400 MG PO TABS: 400.0000 mg | ORAL_TABLET | Freq: Two times a day (BID) | ORAL | 0 refills | Status: DC

## 2015-10-12 NOTE — ED Notes (Signed)
Pt had a fall 2 weeks ago. Pt stating that leg hurt, but it was worse the next day when she stood and the leg popped. Leg is throbbing at all time. The pain does wake pt up during the night. Pt stating that she has taken medication and used ice and other means to help with the pain. Pt came in today to make sure that she wasn't causing further damage. Pt stating that if she puts full weight on her RLE that it will "give." Pt stating it feels bruised on the lateral aspect of the right lower leg

## 2015-10-12 NOTE — ED Triage Notes (Addendum)
Slipped on a throw rug 2 weeks ago, injured right knee.  Initially heard a "popping" noise from knee.  Also hears knee popping when standing up from couch.

## 2015-10-12 NOTE — ED Provider Notes (Signed)
[facility] Emergency Department Provider Note   ____________________________________________   First MD Initiated Contact with Patient 10/12/15 1141     (approximate)  I have reviewed the triage vital signs and the nursing notes.   HISTORY  Chief Complaint Knee Injury    HPI [name] is a 40 y.o. female is here with complaint of knee pain. Patient states that she slipped on a throw rug approximately 2 weeks ago and has continued to have pain since that time. Patient states she initially heard a "popping" noise from her knee and his continued to have that when she stands after sitting for a length of time. Patient been taking ibuprofen 200 mg every 4 hours along with Tylenol to help control the pain. She denies any head injury with her fall at that time. Patient also relates that she had a skiing accident approximately 20 years ago where she "dislocated her knee" and it  corrected itself without wearing a brace.At this time she rates her pain as a 7 out of 10. Pain is worse with movement and walking.   Past Medical History:  Diagnosis Date  . Ache in joint 02/04/2013  . Addiction, opium (HCC) 01/28/2012  . Anemia    H/O  . Complication of anesthesia   . Endometriosis   . Family history of adverse reaction to anesthesia    DAD-N/V, HEADACHES  . Heart murmur   . History of methicillin resistant staphylococcus aureus (MRSA) 2012  . Hypertension    H/O-BEEN AT LEAST 5 YEARS  . Hypothyroidism   . Interstitial cystitis   . Kidney stones    H/O CHRONIC KIDNEY STONE  . LBP (low back pain) 05/23/2013  . PONV (postoperative nausea and vomiting)   . TIA (transient ischemic attack) 2007    Patient Active Problem List   Diagnosis Date Noted  . Pain medication agreement broken 09/16/2015  . Intractable abdominal pain 08/21/2015  . Chronic abdominal pain (Location of Primary Source of Pain) (RLQ: right lower quadrant) (Right) 03/25/2015  . Chronic  flank pain (Location of Secondary source of pain) (Right) 03/25/2015  . Chronic groin pain (Location of Tertiary source of pain) (Right) 03/01/2015  . Chronic pain 11/27/2014  . Long term current use of opiate analgesic 11/27/2014  . Long term prescription opiate use 11/27/2014  . Opiate use (380 MME/Day) 11/27/2014  . Encounter for therapeutic drug level monitoring 11/27/2014  . Encounter for chronic pain management 11/27/2014  . Chronic low back pain (Location of Primary Source of Pain) (Bilateral) (R>L) 11/27/2014  . Osteoarthrosis 11/27/2014  . 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 Methadone use (HCC) 11/27/2014  . History of methadone use (HCC) 11/27/2014  . Pain due to interstitial cystitis 11/27/2014  . Chronic interstitial cystitis 11/27/2014  . Chronic pelvic pain (Location of Secondary source of pain) (suprapubic) (Bilateral) (R>L) 11/27/2014  . Chronic hip pain (Right) 11/27/2014  . Obesity, Class I, BMI 30-34.9 (68% higher incidence of chronic low back pain) 11/27/2014  . History of migraine 11/27/2014  . Encounter for long-term (current) use of other high-risk medications 11/27/2014  . 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 Central chest pain 08/17/2013  . Cannot sleep 02/04/2013  . Ache in joint 02/04/2013  . Affective disorder (HCC) 06/16/2012  . Episodic mood disorder (HCC) 06/16/2012  . Flank pain 03/15/2012  . Anxiety state 01/28/2012  . Opioid dependence (HCC) 01/28/2012  . Continuous opioid dependence (HCC) 03/31/2011  .  Calculus of kidney 03/31/2011    Past Surgical History:  Procedure Laterality Date  . ABDOMINAL HYSTERECTOMY    . APPENDECTOMY    . BREAST ENHANCEMENT SURGERY    . carpal tunnel    . CARPAL TUNNEL RELEASE Left 01/28/2015   Procedure: CARPAL TUNNEL RELEASE;  Surgeon: Deeann Saint, MD;  Location: [facility] ORS;  Service: Orthopedics;  Laterality: Left;  . CESAREAN SECTION     X2  .  CYSTOSCOPY WITH STENT PLACEMENT Left 07/31/2015   Procedure: CYSTOSCOPY WITH STENT PLACEMENT;  Surgeon: Hildred Laser, MD;  Location: [facility] ORS;  Service: Urology;  Laterality: Left;  . DIAGNOSTIC LAPAROSCOPY    . LITHOTRIPSY    . URETEROSCOPY WITH HOLMIUM LASER LITHOTRIPSY Left 07/31/2015   Procedure: URETEROSCOPY WITH HOLMIUM LASER LITHOTRIPSY;  Surgeon: Hildred Laser, MD;  Location: [facility] ORS;  Service: Urology;  Laterality: Left;    Prior to Admission medications   Medication Sig Start Date End Date Taking? Authorizing Provider  busPIRone (BUSPAR) 5 MG tablet Take 5 mg by mouth 3 (three) times daily.    Historical Provider, MD  cloNIDine (CATAPRES) 0.1 MG tablet Take 0.1 mg by mouth 3 (three) times daily.    Historical Provider, MD  etodolac (LODINE) 400 MG tablet Take 1 tablet (400 mg total) by mouth 2 (two) times daily. 10/12/15   Tommi Rumps, PA-C  methadone (DOLOPHINE) 5 MG tablet Take 2 tabs PO 3 times a day and 1 tab PO at HS x 7 days 09/25/15 10/02/15  Delano Metz, MD  methadone (DOLOPHINE) 5 MG tablet Take 1 tablet (5 mg total) by mouth 6 (six) times daily. 10/02/15 10/09/15  Delano Metz, MD  methadone (DOLOPHINE) 5 MG tablet Take 1 tablet (5 mg total) by mouth 5 (five) times daily. 10/09/15 10/16/15  Delano Metz, MD  methadone (DOLOPHINE) 5 MG tablet Take 1 tablet (5 mg total) by mouth 4 (four) times daily. 10/16/15 10/23/15  Delano Metz, MD  methadone (DOLOPHINE) 5 MG tablet Take 1 tablet (5 mg total) by mouth 3 (three) times daily. 10/23/15 10/30/15  Delano Metz, MD  methadone (DOLOPHINE) 5 MG tablet Take 1 tablet (5 mg total) by mouth 2 (two) times daily. 10/30/15 11/06/15  Delano Metz, MD  methadone (DOLOPHINE) 5 MG tablet Take 1 tablet (5 mg total) by mouth daily. 11/06/15 11/13/15  Delano Metz, MD  methadone (DOLOPHINE) 5 MG tablet Take 1 tablet (5 mg total) by mouth every other day. 11/13/15 11/20/15  Delano Metz, MD    ondansetron (ZOFRAN ODT) 4 MG disintegrating tablet Take 1 tablet (4 mg total) by mouth every 8 (eight) hours as needed for nausea or vomiting. 07/31/15   Hildred Laser, MD  oxyCODONE (OXY IR/ROXICODONE) 5 MG immediate release tablet Take 2 tabs PO 3 times a day and 1 tab PO at HS x 7 days 09/25/15 10/02/15  Delano Metz, MD  oxyCODONE (OXY IR/ROXICODONE) 5 MG immediate release tablet Take 1 tablet (5 mg total) by mouth 6 (six) times daily. 10/02/15 10/09/15  Delano Metz, MD  oxyCODONE (OXY IR/ROXICODONE) 5 MG immediate release tablet Take 1 tablet (5 mg total) by mouth 5 (five) times daily. 10/09/15 10/16/15  Delano Metz, MD  oxyCODONE (OXY IR/ROXICODONE) 5 MG immediate release tablet Take 1 tablet (5 mg total) by mouth 4 (four) times daily. 10/16/15 10/23/15  Delano Metz, MD  oxyCODONE (OXY IR/ROXICODONE) 5 MG immediate release tablet Take 1 tablet (5 mg total) by mouth 3 (three) times daily. 10/23/15 10/30/15  Delano MetzFrancisco Naveira, MD  oxyCODONE (OXY IR/ROXICODONE) 5 MG immediate release tablet Take 1 tablet (5 mg total) by mouth 2 (two) times daily. 10/30/15 11/06/15  Delano MetzFrancisco Naveira, MD  oxyCODONE (OXY IR/ROXICODONE) 5 MG immediate release tablet Take 1 tablet (5 mg total) by mouth daily. 11/06/15 11/13/15  Delano MetzFrancisco Naveira, MD  oxyCODONE (OXY IR/ROXICODONE) 5 MG immediate release tablet Take 1 tablet (5 mg total) by mouth every other day. 11/13/15 11/20/15  Delano MetzFrancisco Naveira, MD  QUEtiapine (SEROQUEL) 100 MG tablet Take 100 mg by mouth at bedtime.    Historical Provider, MD    Allergies Codeine; Butorphanol tartrate; Ketorolac; Promethazine; Sumatriptan succinate; Topamax [topiramate]; Toradol [ketorolac tromethamine]; Tramadol; and Sulfa antibiotics  No family history on file.  Social History Social History  Substance Use Topics  . Smoking status: Never Smoker  . Smokeless tobacco: Never Used  . Alcohol use No    Review of Systems Constitutional: No  fever/chills Cardiovascular: Denies chest pain. Respiratory: Denies shortness of breath. Gastrointestinal: No abdominal pain.  No nausea, no vomiting.   Musculoskeletal: Negative for back pain.Positive right knee pain. Skin: Negative for rash. Neurological: Negative for headaches, focal weakness or numbness.  10-point ROS otherwise negative.  ____________________________________________   PHYSICAL EXAM:  VITAL SIGNS: ED Triage Vitals  Enc Vitals Group     BP 10/12/15 1109 (!) 134/96     Pulse Rate 10/12/15 1109 (!) 105     Resp 10/12/15 1109 16     Temp 10/12/15 1109 97.5 F (36.4 C)     Temp Source 10/12/15 1109 Oral     SpO2 10/12/15 1109 97 %     Weight 10/12/15 1104 122 lb (55.3 kg)     Height 10/12/15 1104 5' (1.524 m)     Head Circumference --      Peak Flow --      Pain Score 10/12/15 1104 7     Pain Loc --      Pain Edu? --      Excl. in GC? --     Constitutional: Alert and oriented. Well appearing and in no acute distress. Eyes: Conjunctivae are normal. PERRL. EOMI. Head: Atraumatic. Nose: No congestion/rhinnorhea. Neck: No stridor.   Cardiovascular: Normal rate, regular rhythm. Grossly normal heart sounds.  Good peripheral circulation. Respiratory: Normal respiratory effort.  No retractions. Lungs CTAB. Musculoskeletal: On examination of the right knee there is no gross deformity. There is some laxity with stress of the lateral ligaments. No movement is seen with stress on the medial aspect of the right knee. There is no edema or effusion present. Range of motion with minimal crepitus. There is no erythema, ecchymosis or warmth in the area. Neurologic:  Normal speech and language. No gross focal neurologic deficits are appreciated. No gait instability. Skin:  Skin is warm, dry and intact. No rash noted. Psychiatric: Mood and affect are normal. Speech and behavior are normal.  ____________________________________________   LABS (all labs ordered are listed,  but only abnormal results are displayed)  Labs Reviewed - No data to display   RADIOLOGY Right knee x-ray per radiologist: Negative. I, Tommi Rumpshonda L Emme Rosenau, personally viewed and evaluated these images (plain radiographs) as part of my medical decision making, as well as reviewing the written report by the radiologist.  ____________________________________________   PROCEDURES  Procedure(s) performed: None  Procedures  Critical Care performed: No  ____________________________________________   INITIAL IMPRESSION / ASSESSMENT AND PLAN / ED COURSE  Pertinent labs & imaging results that were available during  my care of the patient were reviewed by me and considered in my medical decision making (see chart for details).    Clinical Course  Patient is to discontinue taking ibuprofen and Tylenol. She is given a prescription for etodolac 400 mg twice a day with food. She is to wear the knee immobilizer when up walking. She was told that she does not have to wear it while sleeping. She is to follow-up with Dr. Rosita Kea for any continued knee problems. She is aware that her x-rays today did not show any bony abnormality.  ____________________________________________   FINAL CLINICAL IMPRESSION(S) / ED DIAGNOSES  Final diagnoses:  Acute pain of right knee      NEW MEDICATIONS STARTED DURING THIS VISIT:  New Prescriptions   ETODOLAC (LODINE) 400 MG TABLET    Take 1 tablet (400 mg total) by mouth 2 (two) times daily.     Note:  This document was prepared using Dragon voice recognition software and may include unintentional dictation errors.    Tommi Rumps, PA-C 10/12/15 1214    Willy Eddy, MD 10/12/15 [phone]

## 2015-10-15 ENCOUNTER — Ambulatory Visit: Payer: Self-pay | Admitting: Pain Medicine

## 2015-11-26 ENCOUNTER — Emergency Department
Admission: EM | Admit: 2015-11-26 | Discharge: 2015-11-27 | Disposition: A | Discharge status: Home / Self Care | Payer: BLUE CROSS/BLUE SHIELD | Attending: Emergency Medicine | Admitting: Emergency Medicine

## 2015-11-26 DIAGNOSIS — I10 Essential (primary) hypertension: Secondary | ICD-10-CM | POA: Insufficient documentation

## 2015-11-26 DIAGNOSIS — E039 Hypothyroidism, unspecified: Secondary | ICD-10-CM | POA: Diagnosis not present

## 2015-11-26 DIAGNOSIS — R440 Auditory hallucinations: Secondary | ICD-10-CM | POA: Diagnosis present

## 2015-11-26 DIAGNOSIS — Z79899 Other long term (current) drug therapy: Secondary | ICD-10-CM | POA: Diagnosis not present

## 2015-11-26 DIAGNOSIS — F13239 Sedative, hypnotic or anxiolytic dependence with withdrawal, unspecified: Secondary | ICD-10-CM | POA: Insufficient documentation

## 2015-11-26 DIAGNOSIS — F13939 Sedative, hypnotic or anxiolytic use, unspecified with withdrawal, unspecified: Secondary | ICD-10-CM

## 2015-11-26 LAB — URINALYSIS COMPLETE WITH MICROSCOPIC (ARMC ONLY)
BACTERIA UA: NONE SEEN
BILIRUBIN URINE: NEGATIVE
Glucose, UA: NEGATIVE mg/dL
KETONES UR: NEGATIVE mg/dL
Nitrite: NEGATIVE
PH: 5 (ref 5.0–8.0)
PROTEIN: 100 mg/dL — AB
Specific Gravity, Urine: 1.029 (ref 1.005–1.030)

## 2015-11-26 LAB — COMPREHENSIVE METABOLIC PANEL
ALBUMIN: 4.3 g/dL (ref 3.5–5.0)
ALK PHOS: 90 U/L (ref 38–126)
ALT: 18 U/L (ref 14–54)
AST: 21 U/L (ref 15–41)
Anion gap: 11 (ref 5–15)
BUN: 8 mg/dL (ref 6–20)
CHLORIDE: 103 mmol/L (ref 101–111)
CO2: 26 mmol/L (ref 22–32)
CREATININE: 1.43 mg/dL — AB (ref 0.44–1.00)
Calcium: 9.8 mg/dL (ref 8.9–10.3)
GFR calc non Af Amer: 44 mL/min — ABNORMAL LOW (ref >60)
GFR, EST AFRICAN AMERICAN: 52 mL/min — AB (ref >60)
GLUCOSE: 85 mg/dL (ref 65–99)
Potassium: 3.5 mmol/L (ref 3.5–5.1)
SODIUM: 140 mmol/L (ref 135–145)
Total Bilirubin: 0.8 mg/dL (ref 0.3–1.2)
Total Protein: 7.5 g/dL (ref 6.5–8.1)

## 2015-11-26 LAB — URINE DRUG SCREEN, QUALITATIVE (ARMC ONLY)
Amphetamines, Ur Screen: NOT DETECTED
BARBITURATES, UR SCREEN: NOT DETECTED
BENZODIAZEPINE, UR SCRN: NOT DETECTED
Cannabinoid 50 Ng, Ur ~~LOC~~: NOT DETECTED
Cocaine Metabolite,Ur ~~LOC~~: NOT DETECTED
MDMA (Ecstasy)Ur Screen: NOT DETECTED
METHADONE SCREEN, URINE: POSITIVE — AB
OPIATE, UR SCREEN: POSITIVE — AB
PHENCYCLIDINE (PCP) UR S: NOT DETECTED
Tricyclic, Ur Screen: POSITIVE — AB

## 2015-11-26 LAB — CBC
HCT: 42.2 % (ref 35.0–47.0)
HEMOGLOBIN: 14.4 g/dL (ref 12.0–16.0)
MCH: 29.2 pg (ref 26.0–34.0)
MCHC: 34.1 g/dL (ref 32.0–36.0)
MCV: 85.8 fL (ref 80.0–100.0)
PLATELETS: 274 10*3/uL (ref 150–440)
RBC: 4.92 MIL/uL (ref 3.80–5.20)
RDW: 14.2 % (ref 11.5–14.5)
WBC: 10.2 10*3/uL (ref 3.6–11.0)

## 2015-11-26 LAB — ETHANOL: Alcohol, Ethyl (B): <5 mg/dL (ref <5)

## 2015-11-26 NOTE — ED Triage Notes (Signed)
Pt states yesterday she experienced visual and auditory hallucinations states has no history of the same. Pt is scheduled to have surgery tomorrow due to fx of the right 4th finger. Pt has no other psychiatric history other then anxiety.

## 2015-11-26 NOTE — ED Provider Notes (Signed)
[facility] Emergency Department Provider Note  ____________________________________________   First MD Initiated Contact with Patient 11/26/15 2342     (approximate)  I have reviewed the triage vital signs and the nursing notes.   HISTORY  Chief Complaint Hallucinations    HPI [name] is a 40 y.o. female with a history of anxiety and depression [facility] with self perceived hallucinations. Patient states that she has experienced "vivid dreams" secondary to Seroquel use (indicated for insomnia) since Saturday. Patient states that her vivid dreams disturbed her during the day. She denies hearing voices inside her head or seeing objects, locations or people that others around her do not see. Patient denies suicidal or homicidal ideation.She typically uses Klonopin for anxiety and insomnia. However, her primary care provider has already filled a month's supply for November. Patient denies other changes to her standard drug regimen for pain  management. Patient states that she has never experienced hallucinations before.   Past Medical History:  Diagnosis Date  . Ache in joint 02/04/2013  . Addiction, opium (HCC) 01/28/2012  . Anemia    H/O  . Complication of anesthesia   . Endometriosis   . Family history of adverse reaction to anesthesia    DAD-N/V, HEADACHES  . Heart murmur   . History of methicillin resistant staphylococcus aureus (MRSA) 2012  . Hypertension    H/O-BEEN AT LEAST 5 YEARS  . Hypothyroidism   . Interstitial cystitis   . Kidney stones    H/O CHRONIC KIDNEY STONE  . LBP (low back pain) 05/23/2013  . PONV (postoperative nausea and vomiting)   . TIA (transient ischemic attack) 2007    Patient Active Problem List   Diagnosis Date Noted  . Pain medication agreement broken 09/16/2015  . Intractable abdominal pain 08/21/2015  . Chronic abdominal pain (Location of Primary Source of Pain) (RLQ:  right lower quadrant) (Right) 03/25/2015  . Chronic flank pain (Location of Secondary source of pain) (Right) 03/25/2015  . Chronic groin pain (Location of Tertiary source of pain) (Right) 03/01/2015  . Chronic pain 11/27/2014  . Long term current use of opiate analgesic 11/27/2014  . Long term prescription opiate use 11/27/2014  . Opiate use (380 MME/Day) 11/27/2014  . Encounter for therapeutic drug level monitoring 11/27/2014  . Encounter for chronic pain management 11/27/2014  . Chronic low back pain (Location of Primary Source of Pain) (Bilateral) (R>L) 11/27/2014  . Osteoarthrosis 11/27/2014  . 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 Methadone use (HCC) 11/27/2014  . History of methadone use (HCC) 11/27/2014  . Pain due to interstitial cystitis 11/27/2014  . Chronic interstitial cystitis 11/27/2014  . Chronic pelvic pain (Location of Secondary source of pain) (suprapubic) (Bilateral) (R>L) 11/27/2014  . Chronic hip pain (Right) 11/27/2014  . Obesity, Class I, BMI 30-34.9 (68% higher incidence of chronic low back pain) 11/27/2014  . History of migraine 11/27/2014  . Encounter for long-term (current) use of other high-risk medications 11/27/2014  . 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 Central chest pain 08/17/2013  . Cannot sleep 02/04/2013  . Ache in joint 02/04/2013  . Affective disorder (HCC) 06/16/2012  . Episodic mood disorder (HCC) 06/16/2012  . Flank pain 03/15/2012  . Anxiety state 01/28/2012  . Opioid dependence (HCC) 01/28/2012  . Continuous opioid dependence (HCC) 03/31/2011  . Calculus of kidney 03/31/2011    Past Surgical History:  Procedure Laterality Date  . ABDOMINAL HYSTERECTOMY    .  APPENDECTOMY    . BREAST ENHANCEMENT SURGERY    . carpal tunnel    . CARPAL TUNNEL RELEASE Left 01/28/2015   Procedure: CARPAL TUNNEL RELEASE;  Surgeon: Deeann Saint, MD;  Location: [facility] ORS;  Service: Orthopedics;   Laterality: Left;  . CESAREAN SECTION     X2  . CYSTOSCOPY WITH STENT PLACEMENT Left 07/31/2015   Procedure: CYSTOSCOPY WITH STENT PLACEMENT;  Surgeon: Hildred Laser, MD;  Location: [facility] ORS;  Service: Urology;  Laterality: Left;  . DIAGNOSTIC LAPAROSCOPY    . LITHOTRIPSY    . URETEROSCOPY WITH HOLMIUM LASER LITHOTRIPSY Left 07/31/2015   Procedure: URETEROSCOPY WITH HOLMIUM LASER LITHOTRIPSY;  Surgeon: Hildred Laser, MD;  Location: [facility] ORS;  Service: Urology;  Laterality: Left;    Prior to Admission medications   Medication Sig Start Date End Date Taking? Authorizing Provider  busPIRone (BUSPAR) 5 MG tablet Take 5 mg by mouth 3 (three) times daily.    Historical Provider, MD  cloNIDine (CATAPRES) 0.1 MG tablet Take 0.1 mg by mouth 3 (three) times daily.    Historical Provider, MD  etodolac (LODINE) 400 MG tablet Take 1 tablet (400 mg total) by mouth 2 (two) times daily. 10/12/15   Tommi Rumps, PA-C  methadone (DOLOPHINE) 5 MG tablet Take 2 tabs PO 3 times a day and 1 tab PO at HS x 7 days 09/25/15 10/02/15  Delano Metz, MD  methadone (DOLOPHINE) 5 MG tablet Take 1 tablet (5 mg total) by mouth 6 (six) times daily. 10/02/15 10/09/15  Delano Metz, MD  methadone (DOLOPHINE) 5 MG tablet Take 1 tablet (5 mg total) by mouth 5 (five) times daily. 10/09/15 10/16/15  Delano Metz, MD  methadone (DOLOPHINE) 5 MG tablet Take 1 tablet (5 mg total) by mouth 4 (four) times daily. 10/16/15 10/23/15  Delano Metz, MD  methadone (DOLOPHINE) 5 MG tablet Take 1 tablet (5 mg total) by mouth 3 (three) times daily. 10/23/15 10/30/15  Delano Metz, MD  methadone (DOLOPHINE) 5 MG tablet Take 1 tablet (5 mg total) by mouth 2 (two) times daily. 10/30/15 11/06/15  Delano Metz, MD  methadone (DOLOPHINE) 5 MG tablet Take 1 tablet (5 mg total) by mouth daily. 11/06/15 11/13/15  Delano Metz, MD  methadone (DOLOPHINE) 5 MG tablet Take 1 tablet (5 mg total) by mouth every other day.  11/13/15 11/20/15  Delano Metz, MD  ondansetron (ZOFRAN ODT) 4 MG disintegrating tablet Take 1 tablet (4 mg total) by mouth every 8 (eight) hours as needed for nausea or vomiting. 07/31/15   Hildred Laser, MD  oxyCODONE (OXY IR/ROXICODONE) 5 MG immediate release tablet Take 2 tabs PO 3 times a day and 1 tab PO at HS x 7 days 09/25/15 10/02/15  Delano Metz, MD  oxyCODONE (OXY IR/ROXICODONE) 5 MG immediate release tablet Take 1 tablet (5 mg total) by mouth 6 (six) times daily. 10/02/15 10/09/15  Delano Metz, MD  oxyCODONE (OXY IR/ROXICODONE) 5 MG immediate release tablet Take 1 tablet (5 mg total) by mouth 5 (five) times daily. 10/09/15 10/16/15  Delano Metz, MD  oxyCODONE (OXY IR/ROXICODONE) 5 MG immediate release tablet Take 1 tablet (5 mg total) by mouth 4 (four) times daily. 10/16/15 10/23/15  Delano Metz, MD  oxyCODONE (OXY IR/ROXICODONE) 5 MG immediate release tablet Take 1 tablet (5 mg total) by mouth 3 (three) times daily. 10/23/15 10/30/15  Delano Metz, MD  oxyCODONE (OXY IR/ROXICODONE) 5 MG immediate release tablet Take 1 tablet (5 mg total) by mouth 2 (two)  times daily. 10/30/15 11/06/15  Delano MetzFrancisco Naveira, MD  oxyCODONE (OXY IR/ROXICODONE) 5 MG immediate release tablet Take 1 tablet (5 mg total) by mouth daily. 11/06/15 11/13/15  Delano MetzFrancisco Naveira, MD  oxyCODONE (OXY IR/ROXICODONE) 5 MG immediate release tablet Take 1 tablet (5 mg total) by mouth every other day. 11/13/15 11/20/15  Delano MetzFrancisco Naveira, MD  QUEtiapine (SEROQUEL) 100 MG tablet Take 100 mg by mouth at bedtime.    Historical Provider, MD    Allergies Codeine; Butorphanol tartrate; Ketorolac; Promethazine; Sumatriptan succinate; Topamax [topiramate]; Toradol [ketorolac tromethamine]; Tramadol; and Sulfa antibiotics  No family history on file.  Social History Social History  Substance Use Topics  . Smoking status: Never Smoker  . Smokeless tobacco: Never Used  . Alcohol use No    Review of  Systems Constitutional: No fever/chills Eyes: No visual changes. Cardiovascular: Denies chest pain. Respiratory: Denies shortness of breath. Gastrointestinal: No abdominal pain.  No nausea, no vomiting.  No diarrhea.  No constipation. Genitourinary: Negative for dysuria. Musculoskeletal: Negative for back pain. Skin: Negative for rash. Neurological: Negative for headaches, focal weakness or numbness. Psych: Patient has anxiety and depression.  10-point ROS otherwise negative.  ____________________________________________   PHYSICAL EXAM:  VITAL SIGNS: ED Triage Vitals  Enc Vitals Group     BP 11/26/15 2037 95/80     Pulse Rate 11/26/15 2037 89     Resp 11/26/15 2037 18     Temp 11/26/15 2037 98.5 F (36.9 C)     Temp Source 11/26/15 2037 Oral     SpO2 11/26/15 2037 100 %     Weight 11/26/15 2038 130 lb (59 kg)     Height 11/26/15 2038 5' (1.524 m)     Head Circumference --      Peak Flow --      Pain Score 11/26/15 2038 7     Pain Loc --      Pain Edu? --      Excl. in GC? --     Constitutional: Alert and oriented. Well appearing and in no acute distress. Eyes: Conjunctivae are normal. PERRL. EOMI. Head: Atraumatic. Mouth/Throat: Mucous membranes are moist.  Cardiovascular: Normal rate, regular rhythm. Grossly normal heart sounds.  Good peripheral circulation. Respiratory: Normal respiratory effort.  No retractions. Lungs CTAB. Gastrointestinal: Soft and nontender. No distention. No abdominal bruits. No CVA tenderness. Musculoskeletal: No lower extremity tenderness nor edema.  No joint effusions. Neurologic:  Normal speech and language. No gross focal neurologic deficits are appreciated. No gait instability. Skin:  Skin is warm, dry and intact. No rash noted. Psychiatric: Speech is normal. Patient seems calm throughout exam.   ____________________________________________   LABS (all labs ordered are listed, but only abnormal results are displayed)  Labs Reviewed   COMPREHENSIVE METABOLIC PANEL - Abnormal; Notable for the following:       Result Value   Creatinine, Ser 1.43 (*)    GFR calc non Af Amer 44 (*)    GFR calc Af Amer 52 (*)    All other components within normal limits  URINALYSIS COMPLETEWITH MICROSCOPIC ([facility] ONLY) - Abnormal; Notable for the following:    Color, Urine YELLOW (*)    APPearance CLOUDY (*)    Hgb urine dipstick 1+ (*)    Protein, ur 100 (*)    Leukocytes, UA 2+ (*)    Squamous Epithelial / LPF 6-30 (*)    All other components within normal limits  URINE DRUG SCREEN, QUALITATIVE ([facility] ONLY) - Abnormal; Notable for the following:  Tricyclic, Ur Screen POSITIVE (*)    Opiate, Ur Screen POSITIVE (*)    Methadone Scn, Ur POSITIVE (*)    All other components within normal limits  CBC  ETHANOL   ____________________________________________    PROCEDURES  Procedure(s) performed:   Procedures: None    ____________________________________________   INITIAL IMPRESSION / ASSESSMENT AND PLAN / ED COURSE  Pertinent labs & imaging results that were available during my care of the patient were reviewed by me and considered in my medical decision making (see chart for details).    Clinical Course    Assessment and Plan:  Drug withdrawal: Patient has been experiencing vivid dreams, likely secondary to Seroquel use for insomnia. Patient was advised to avoid using Seroquel for insomnia, as it seems to be the trigger for her vivid dreams.Patient denies homicidal or suicidal ideation. Patient states that Klonopin also helps with her insomnia. However, her primary care provider filled the prescription on 11/06/2015. A refill of Klonopin was not given during this emergency department visit. Return precautions were given. All questions were answered.   ____________________________________________   FINAL CLINICAL IMPRESSION(S) / ED DIAGNOSES  Final diagnoses:  Withdrawal from sedative, hypnotic, or anxiolytic drug  (HCC)      NEW MEDICATIONS STARTED DURING THIS VISIT:  New Prescriptions   No medications on file     Note:  This document was prepared using Dragon voice recognition software and may include unintentional dictation errors.    Orvil Feil, PA-C 11/27/15 [id number]    Darci Current, MD 11/27/15 [phone]

## 2016-01-11 ENCOUNTER — Emergency Department
Admission: EM | Admit: 2016-01-11 | Discharge: 2016-01-11 | Disposition: A | Discharge status: Home / Self Care | Payer: BLUE CROSS/BLUE SHIELD | Attending: Emergency Medicine | Admitting: Emergency Medicine

## 2016-01-11 ENCOUNTER — Emergency Department: Payer: BLUE CROSS/BLUE SHIELD

## 2016-01-11 ENCOUNTER — Encounter: Payer: Self-pay | Admitting: Emergency Medicine

## 2016-01-11 DIAGNOSIS — Z79899 Other long term (current) drug therapy: Secondary | ICD-10-CM | POA: Insufficient documentation

## 2016-01-11 DIAGNOSIS — I10 Essential (primary) hypertension: Secondary | ICD-10-CM | POA: Insufficient documentation

## 2016-01-11 DIAGNOSIS — S92512A Displaced fracture of proximal phalanx of left lesser toe(s), initial encounter for closed fracture: Secondary | ICD-10-CM | POA: Diagnosis not present

## 2016-01-11 DIAGNOSIS — Y999 Unspecified external cause status: Secondary | ICD-10-CM | POA: Insufficient documentation

## 2016-01-11 DIAGNOSIS — G43001 Migraine without aura, not intractable, with status migrainosus: Secondary | ICD-10-CM

## 2016-01-11 DIAGNOSIS — W228XXA Striking against or struck by other objects, initial encounter: Secondary | ICD-10-CM | POA: Diagnosis not present

## 2016-01-11 DIAGNOSIS — E039 Hypothyroidism, unspecified: Secondary | ICD-10-CM | POA: Insufficient documentation

## 2016-01-11 DIAGNOSIS — F1910 Other psychoactive substance abuse, uncomplicated: Secondary | ICD-10-CM | POA: Insufficient documentation

## 2016-01-11 DIAGNOSIS — S99922A Unspecified injury of left foot, initial encounter: Secondary | ICD-10-CM | POA: Diagnosis present

## 2016-01-11 DIAGNOSIS — S92502A Displaced unspecified fracture of left lesser toe(s), initial encounter for closed fracture: Secondary | ICD-10-CM

## 2016-01-11 DIAGNOSIS — Y939 Activity, unspecified: Secondary | ICD-10-CM | POA: Diagnosis not present

## 2016-01-11 DIAGNOSIS — Y929 Unspecified place or not applicable: Secondary | ICD-10-CM | POA: Diagnosis not present

## 2016-01-11 MED ORDER — DIPHENHYDRAMINE HCL 50 MG/ML IJ SOLN
12.5000 mg | Freq: Once | INTRAMUSCULAR | Status: AC
  Administered 2016-01-11: 12.5 mg via INTRAVENOUS
  Filled 2016-01-11: qty 1

## 2016-01-11 MED ORDER — METHYLPREDNISOLONE SODIUM SUCC 125 MG IJ SOLR
80.0000 mg | Freq: Once | INTRAMUSCULAR | Status: AC
  Administered 2016-01-11: 80 mg via INTRAVENOUS
  Filled 2016-01-11: qty 2

## 2016-01-11 MED ORDER — ORPHENADRINE CITRATE 30 MG/ML IJ SOLN
30.0000 mg | Freq: Two times a day (BID) | INTRAMUSCULAR | Status: DC
  Administered 2016-01-11: 30 mg via INTRAVENOUS
  Filled 2016-01-11: qty 2

## 2016-01-11 MED ORDER — METOCLOPRAMIDE HCL 10 MG PO TABS
20.0000 mg | ORAL_TABLET | Freq: Once | ORAL | Status: AC
  Administered 2016-01-11: 20 mg via ORAL
  Filled 2016-01-11: qty 2

## 2016-01-11 MED ORDER — SODIUM CHLORIDE 0.9 % IV BOLUS (SEPSIS)
1000.0000 mL | Freq: Once | INTRAVENOUS | Status: AC
  Administered 2016-01-11: 1000 mL via INTRAVENOUS

## 2016-01-11 MED ORDER — BUTALBITAL-APAP-CAFFEINE 50-325-40 MG PO TABS: 1.0000 | ORAL_TABLET | Freq: Four times a day (QID) | ORAL | 0 refills | Status: DC | PRN

## 2016-01-11 NOTE — ED Triage Notes (Signed)
States has headache and dizziness x 5 days. States has history of migraines and that this is her typical presentation.

## 2016-01-11 NOTE — Discharge Instructions (Signed)
Wear open shoe until evaluation by orthopedics. °

## 2016-01-11 NOTE — ED Notes (Signed)
IV cath infiltrated in hand, pt received aprox of fluid, provider notified. Pt hand has slight swelling noted. Ice given

## 2016-01-11 NOTE — ED Provider Notes (Signed)
[facility] Emergency Department Provider Note   ____________________________________________   First MD Initiated Contact with Patient 01/11/16 1454     (approximate)  I have reviewed the triage vital signs and the nursing notes.   HISTORY  Chief Complaint Migraine    HPI [name] is a 41 y.o. female patient complain of headache, nausea, and vertigo for 5 days. Patient state his presentation [place] a migraine headaches. Patient stated no relief taking Tylenol at home. Patient also complaining of left foot pain secondary to contusion 2 weeks ago. Patient rates the headache as a 6/10. Patient described a pain as "achy".   Past Medical History:  Diagnosis Date  . Ache in joint 02/04/2013  . Addiction, opium (HCC) 01/28/2012  . Anemia    H/O  . Complication of anesthesia   . Endometriosis   . Family history of adverse reaction to anesthesia    DAD-N/V, HEADACHES  . Heart murmur   . History of methicillin resistant staphylococcus aureus (MRSA) 2012  . Hypertension    H/O-BEEN AT LEAST 5 YEARS  . Hypothyroidism   . Interstitial cystitis   . Kidney stones    H/O CHRONIC KIDNEY STONE  . LBP (low back pain) 05/23/2013  . PONV (postoperative nausea and vomiting)   . TIA (transient ischemic attack) 2007    Patient Active Problem List   Diagnosis Date Noted  . Pain medication agreement broken 09/16/2015  . Intractable abdominal pain 08/21/2015  . Chronic abdominal pain (Location of Primary Source of Pain) (RLQ: right lower quadrant) (Right) 03/25/2015  . Chronic flank pain (Location of Secondary source of pain) (Right) 03/25/2015  . Chronic groin pain (Location of Tertiary source of pain) (Right) 03/01/2015  . Chronic pain 11/27/2014  . Long term current use of opiate analgesic 11/27/2014  . Long term prescription opiate use 11/27/2014  . Opiate use (380 MME/Day) 11/27/2014  . Encounter for therapeutic drug level monitoring 11/27/2014  .  Encounter for chronic pain management 11/27/2014  . Chronic low back pain (Location of Primary Source of Pain) (Bilateral) (R>L) 11/27/2014  . Osteoarthrosis 11/27/2014  . 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 Methadone use (HCC) 11/27/2014  . History of methadone use (HCC) 11/27/2014  . Pain due to interstitial cystitis 11/27/2014  . Chronic interstitial cystitis 11/27/2014  . Chronic pelvic pain (Location of Secondary source of pain) (suprapubic) (Bilateral) (R>L) 11/27/2014  . Chronic hip pain (Right) 11/27/2014  . Obesity, Class I, BMI 30-34.9 (68% higher incidence of chronic low back pain) 11/27/2014  . History of migraine 11/27/2014  . Encounter for long-term (current) use of other high-risk medications 11/27/2014  . 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 Central chest pain 08/17/2013  . Cannot sleep 02/04/2013  . Ache in joint 02/04/2013  . Affective disorder (HCC) 06/16/2012  . Episodic mood disorder (HCC) 06/16/2012  . Flank pain 03/15/2012  . Anxiety state 01/28/2012  . Opioid dependence (HCC) 01/28/2012  . Continuous opioid dependence (HCC) 03/31/2011  . Calculus of kidney 03/31/2011    Past Surgical History:  Procedure Laterality Date  . ABDOMINAL HYSTERECTOMY    . APPENDECTOMY    . BREAST ENHANCEMENT SURGERY    . carpal tunnel    . CARPAL TUNNEL RELEASE Left 01/28/2015   Procedure: CARPAL TUNNEL RELEASE;  Surgeon: Deeann Saint, MD;  Location: [facility] ORS;  Service: Orthopedics;  Laterality: Left;  . CESAREAN SECTION     X2  . CYSTOSCOPY WITH  STENT PLACEMENT Left 07/31/2015   Procedure: CYSTOSCOPY WITH STENT PLACEMENT;  Surgeon: Hildred Laser, MD;  Location: [facility] ORS;  Service: Urology;  Laterality: Left;  . DIAGNOSTIC LAPAROSCOPY    . LITHOTRIPSY    . URETEROSCOPY WITH HOLMIUM LASER LITHOTRIPSY Left 07/31/2015   Procedure: URETEROSCOPY WITH HOLMIUM LASER LITHOTRIPSY;  Surgeon: Hildred Laser, MD;   Location: [facility] ORS;  Service: Urology;  Laterality: Left;    Prior to Admission medications   Medication Sig Start Date End Date Taking? Authorizing Provider  busPIRone (BUSPAR) 5 MG tablet Take 5 mg by mouth 3 (three) times daily.    Historical Provider, MD  butalbital-acetaminophen-caffeine (FIORICET, ESGIC) 50-325-40 MG tablet Take 1-2 tablets by mouth every 6 (six) hours as needed for headache. 01/11/16   Joni Reining, PA-C  cloNIDine (CATAPRES) 0.1 MG tablet Take 0.1 mg by mouth 3 (three) times daily.    Historical Provider, MD  etodolac (LODINE) 400 MG tablet Take 1 tablet (400 mg total) by mouth 2 (two) times daily. 10/12/15   Tommi Rumps, PA-C  methadone (DOLOPHINE) 5 MG tablet Take 2 tabs PO 3 times a day and 1 tab PO at HS x 7 days 09/25/15 10/02/15  Delano Metz, MD  methadone (DOLOPHINE) 5 MG tablet Take 1 tablet (5 mg total) by mouth 6 (six) times daily. 10/02/15 10/09/15  Delano Metz, MD  methadone (DOLOPHINE) 5 MG tablet Take 1 tablet (5 mg total) by mouth 5 (five) times daily. 10/09/15 10/16/15  Delano Metz, MD  methadone (DOLOPHINE) 5 MG tablet Take 1 tablet (5 mg total) by mouth 4 (four) times daily. 10/16/15 10/23/15  Delano Metz, MD  methadone (DOLOPHINE) 5 MG tablet Take 1 tablet (5 mg total) by mouth 3 (three) times daily. 10/23/15 10/30/15  Delano Metz, MD  methadone (DOLOPHINE) 5 MG tablet Take 1 tablet (5 mg total) by mouth 2 (two) times daily. 10/30/15 11/06/15  Delano Metz, MD  methadone (DOLOPHINE) 5 MG tablet Take 1 tablet (5 mg total) by mouth daily. 11/06/15 11/13/15  Delano Metz, MD  methadone (DOLOPHINE) 5 MG tablet Take 1 tablet (5 mg total) by mouth every other day. 11/13/15 11/20/15  Delano Metz, MD  ondansetron (ZOFRAN ODT) 4 MG disintegrating tablet Take 1 tablet (4 mg total) by mouth every 8 (eight) hours as needed for nausea or vomiting. 07/31/15   Hildred Laser, MD  oxyCODONE (OXY IR/ROXICODONE) 5 MG immediate  release tablet Take 2 tabs PO 3 times a day and 1 tab PO at HS x 7 days 09/25/15 10/02/15  Delano Metz, MD  oxyCODONE (OXY IR/ROXICODONE) 5 MG immediate release tablet Take 1 tablet (5 mg total) by mouth 6 (six) times daily. 10/02/15 10/09/15  Delano Metz, MD  oxyCODONE (OXY IR/ROXICODONE) 5 MG immediate release tablet Take 1 tablet (5 mg total) by mouth 5 (five) times daily. 10/09/15 10/16/15  Delano Metz, MD  oxyCODONE (OXY IR/ROXICODONE) 5 MG immediate release tablet Take 1 tablet (5 mg total) by mouth 4 (four) times daily. 10/16/15 10/23/15  Delano Metz, MD  oxyCODONE (OXY IR/ROXICODONE) 5 MG immediate release tablet Take 1 tablet (5 mg total) by mouth 3 (three) times daily. 10/23/15 10/30/15  Delano Metz, MD  oxyCODONE (OXY IR/ROXICODONE) 5 MG immediate release tablet Take 1 tablet (5 mg total) by mouth 2 (two) times daily. 10/30/15 11/06/15  Delano Metz, MD  oxyCODONE (OXY IR/ROXICODONE) 5 MG immediate release tablet Take 1 tablet (5 mg total) by mouth daily. 11/06/15 11/13/15  Francisco  Laban Emperor, MD  oxyCODONE (OXY IR/ROXICODONE) 5 MG immediate release tablet Take 1 tablet (5 mg total) by mouth every other day. 11/13/15 11/20/15  Delano Metz, MD  QUEtiapine (SEROQUEL) 100 MG tablet Take 100 mg by mouth at bedtime.    Historical Provider, MD    Allergies Codeine; Butorphanol tartrate; Ketorolac; Promethazine; Sumatriptan succinate; Topamax [topiramate]; Toradol [ketorolac tromethamine]; Tramadol; and Sulfa antibiotics  No family history on file.  Social History Social History  Substance Use Topics  . Smoking status: Never Smoker  . Smokeless tobacco: Never Used  . Alcohol use No    Review of Systems Constitutional: No fever/chills Eyes: No visual changes. ENT: No sore throat. Cardiovascular: Denies chest pain. Respiratory: Denies shortness of breath. Gastrointestinal: No abdominal pain.  No nausea, no vomiting.  No diarrhea.  No  constipation. Genitourinary: Negative for dysuria. Musculoskeletal: Left foot pain Skin: Negative for rash. Neurological: Positive for headaches, focal weakness or numbness. Psychiatric:Substance abuse Endocrine:Hypertension and hypothyroidism. Hematological/Lymphatic: Allergic/Immunilogical: See medication list ____________________________________________   PHYSICAL EXAM:  VITAL SIGNS: ED Triage Vitals  Enc Vitals Group     BP 01/11/16 1400 106/82     Pulse Rate 01/11/16 1400 80     Resp 01/11/16 1400 20     Temp 01/11/16 1400 97.6 F (36.4 C)     Temp Source 01/11/16 1400 Oral     SpO2 01/11/16 1400 99 %     Weight 01/11/16 1400 125 lb (56.7 kg)     Height 01/11/16 1400 5' (1.524 m)     Head Circumference --      Peak Flow --      Pain Score 01/11/16 1401 6     Pain Loc --      Pain Edu? --      Excl. in GC? --     Constitutional: Alert and oriented. Well appearing and in no acute distress. Eyes: Photophobia Head: Atraumatic. Nose: No congestion/rhinnorhea. Mouth/Throat: Mucous membranes are moist.  Oropharynx non-erythematous. Neck: No stridor.  No cervical spine tenderness to palpation. Hematological/Lymphatic/Immunilogical: No cervical lymphadenopathy. Cardiovascular: Normal rate, regular rhythm. Grossly normal heart sounds.  Good peripheral circulation. Respiratory: Normal respiratory effort.  No retractions. Lungs CTAB. Gastrointestinal: Soft and nontender. No distention. No abdominal bruits. No CVA tenderness. Musculoskeletal:No deformities left foot. Mild edema but no erythema.  Neurologic:  Normal speech and language. No gross focal neurologic deficits are appreciated. No gait instability. Skin:  Skin is warm, dry and intact. No rash noted. Psychiatric: Mood and affect are normal. Speech and behavior are normal.  ____________________________________________   LABS (all labs ordered are listed, but only abnormal results are displayed)  Labs Reviewed -  No data to display ____________________________________________  EKG   ____________________________________________  RADIOLOGY  X-ray the left foot shows no acute minimal displaced fracture to the proximal aspect of the left fifth proximal phalanx left foot. ____________________________________________   PROCEDURES  Procedure(s) performed: None  Procedures  Critical Care performed: No  ____________________________________________   INITIAL IMPRESSION / ASSESSMENT AND PLAN / ED COURSE  Pertinent labs & imaging results that were available during my care of the patient were reviewed by me and considered in my medical decision making (see chart for details).  Migraine headache and an toe fracture of the left foot. Patient advised to follow orthopedics for definitive evaluation of the fractured toe since it has been 2 weeks since the injury.  Clinical Course    Discussed x-ray finding with patient. Injuries 2 weeks ago. Advised open pending  evaluation by orthopedics. Patient given a prescription for Fioricet to take as needed for headache. ____________________________________________   FINAL CLINICAL IMPRESSION(S) / ED DIAGNOSES  Final diagnoses:  Migraine without aura and with status migrainosus, not intractable  Fracture of fifth toe, left, closed, initial encounter      NEW MEDICATIONS STARTED DURING THIS VISIT:  New Prescriptions   BUTALBITAL-ACETAMINOPHEN-CAFFEINE (FIORICET, ESGIC) 50-325-40 MG TABLET    Take 1-2 tablets by mouth every 6 (six) hours as needed for headache.     Note:  This document was prepared using Dragon voice recognition software and may include unintentional dictation errors.    Joni Reiningonald K Keir Foland, PA-C 01/11/16 1544    Phineas SemenGraydon Goodman, MD 01/13/16 1105

## 2016-01-11 NOTE — ED Notes (Signed)
Pt c/o migraine, states had one before. C/o blurred vision and nausea. Pt has tried tylenol at hoem with no relief. PT states she also wants to get her lft foot checked from hitting something x2wks ago. Pt A&Ox4

## 2016-01-12 ENCOUNTER — Encounter: Payer: Self-pay | Admitting: Emergency Medicine

## 2016-01-12 ENCOUNTER — Emergency Department
Admission: EM | Admit: 2016-01-12 | Discharge: 2016-01-12 | Disposition: A | Discharge status: Home / Self Care | Payer: BLUE CROSS/BLUE SHIELD | Attending: Emergency Medicine | Admitting: Emergency Medicine

## 2016-01-12 DIAGNOSIS — E039 Hypothyroidism, unspecified: Secondary | ICD-10-CM | POA: Diagnosis not present

## 2016-01-12 DIAGNOSIS — R51 Headache: Secondary | ICD-10-CM | POA: Diagnosis present

## 2016-01-12 DIAGNOSIS — I10 Essential (primary) hypertension: Secondary | ICD-10-CM | POA: Insufficient documentation

## 2016-01-12 DIAGNOSIS — G43909 Migraine, unspecified, not intractable, without status migrainosus: Secondary | ICD-10-CM | POA: Insufficient documentation

## 2016-01-12 MED ORDER — DIPHENHYDRAMINE HCL 50 MG/ML IJ SOLN
50.0000 mg | Freq: Once | INTRAMUSCULAR | Status: AC
  Administered 2016-01-12: 50 mg via INTRAVENOUS
  Filled 2016-01-12: qty 1

## 2016-01-12 MED ORDER — METOCLOPRAMIDE HCL 5 MG/ML IJ SOLN
10.0000 mg | Freq: Once | INTRAMUSCULAR | Status: AC
  Administered 2016-01-12: 10 mg via INTRAVENOUS
  Filled 2016-01-12: qty 2

## 2016-01-12 MED ORDER — MAGNESIUM SULFATE 2 GM/50ML IV SOLN
2.0000 g | Freq: Once | INTRAVENOUS | Status: AC
  Administered 2016-01-12: 2 g via INTRAVENOUS
  Filled 2016-01-12: qty 50

## 2016-01-12 MED ORDER — SODIUM CHLORIDE 0.9 % IV BOLUS (SEPSIS)
1000.0000 mL | Freq: Once | INTRAVENOUS | Status: AC
  Administered 2016-01-12: 1000 mL via INTRAVENOUS

## 2016-01-12 MED ORDER — ACETAMINOPHEN 500 MG PO TABS
1000.0000 mg | ORAL_TABLET | Freq: Once | ORAL | Status: AC
  Administered 2016-01-12: 1000 mg via ORAL
  Filled 2016-01-12: qty 2

## 2016-01-12 NOTE — ED Provider Notes (Signed)
[facility] Emergency Department Provider Note  Time seen: 3:16 PM  I have reviewed the triage vital signs and the nursing notes.   HISTORY  Chief Complaint Headache and Dizziness    HPI [name] is a 41 y.o. female with multiple medical problems including hypertension, chronic pain, kidney sounds, TIA, endometriosis, presents the emergency department for dizziness and headache. According to the patient for the past 5 days she has been experiencing headache along with photo and phonophobia. Patient states a history of migraine headaches which developed approximately 6 years ago after hysterectomy. Patient states her headache feels similar to her typical migraine headache. She has tried over-the-counter medications without relief. Patient came to the emergency department yesterday and was seen for the same. Patient states no improvement after medications given to her in the emergency department. Was also prescribed Fioricet and states no improvement with that medication either so she came back to the emergency department today. Denies any weakness or numbness. Denies fever.  Past Medical History:  Diagnosis Date  . Ache in joint 02/04/2013  . Addiction, opium (HCC) 01/28/2012  . Anemia    H/O  . Complication of anesthesia   . Endometriosis   . Family history of adverse reaction to anesthesia    DAD-N/V, HEADACHES  . Heart murmur   . History of methicillin resistant staphylococcus aureus (MRSA) 2012  . Hypertension    H/O-BEEN AT LEAST 5 YEARS  . Hypothyroidism   . Interstitial cystitis   . Kidney stones    H/O CHRONIC KIDNEY STONE  . LBP (low back pain) 05/23/2013  . PONV (postoperative nausea and vomiting)   . TIA (transient ischemic attack) 2007    Patient Active Problem List   Diagnosis Date Noted  . Pain medication agreement broken 09/16/2015  . Intractable abdominal pain 08/21/2015  . Chronic abdominal pain (Location of Primary Source of  Pain) (RLQ: right lower quadrant) (Right) 03/25/2015  . Chronic flank pain (Location of Secondary source of pain) (Right) 03/25/2015  . Chronic groin pain (Location of Tertiary source of pain) (Right) 03/01/2015  . Chronic pain 11/27/2014  . Long term current use of opiate analgesic 11/27/2014  . Long term prescription opiate use 11/27/2014  . Opiate use (380 MME/Day) 11/27/2014  . Encounter for therapeutic drug level monitoring 11/27/2014  . Encounter for chronic pain management 11/27/2014  . Chronic low back pain (Location of Primary Source of Pain) (Bilateral) (R>L) 11/27/2014  . Osteoarthrosis 11/27/2014  . 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 Methadone use (HCC) 11/27/2014  . History of methadone use (HCC) 11/27/2014  . Pain due to interstitial cystitis 11/27/2014  . Chronic interstitial cystitis 11/27/2014  . Chronic pelvic pain (Location of Secondary source of pain) (suprapubic) (Bilateral) (R>L) 11/27/2014  . Chronic hip pain (Right) 11/27/2014  . Obesity, Class I, BMI 30-34.9 (68% higher incidence of chronic low back pain) 11/27/2014  . History of migraine 11/27/2014  . Encounter for long-term (current) use of other high-risk medications 11/27/2014  . 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 Central chest pain 08/17/2013  . Cannot sleep 02/04/2013  . Ache in joint 02/04/2013  . Affective disorder (HCC) 06/16/2012  . Episodic mood disorder (HCC) 06/16/2012  . Flank pain 03/15/2012  . Anxiety state 01/28/2012  . Opioid dependence (HCC) 01/28/2012  . Continuous opioid dependence (HCC) 03/31/2011  . Calculus of kidney 03/31/2011    Past Surgical History:  Procedure Laterality Date  . ABDOMINAL HYSTERECTOMY    .  APPENDECTOMY    . BREAST ENHANCEMENT SURGERY    . carpal tunnel    . CARPAL TUNNEL RELEASE Left 01/28/2015   Procedure: CARPAL TUNNEL RELEASE;  Surgeon: Deeann Saint, MD;  Location: [facility] ORS;  Service:  Orthopedics;  Laterality: Left;  . CESAREAN SECTION     X2  . CYSTOSCOPY WITH STENT PLACEMENT Left 07/31/2015   Procedure: CYSTOSCOPY WITH STENT PLACEMENT;  Surgeon: Hildred Laser, MD;  Location: [facility] OR[facility];  Service: Urology;  Laterality: Left;  . DIAGNOSTIC LAPAROSCOPY    . LITHOTRIPSY    . URETEROSCOPY WITH HOLMIUM LASER LITHOTRIPSY Left 07/31/2015   Procedure: URETEROSCOPY WITH HOLMIUM LASER LITHOTRIPSY;  Surgeon: Hildred Laser, MD;  Location: [facility] OR[facility];  Service: Urology;  Laterality: Left;    Prior to Admission medications   Medication Sig Start Date End Date Taking? Authorizing Provider  busPIRone (BUSPAR) 5 MG tablet Take 5 mg by mouth 3 (three) times daily.    Historical Provider, MD  butalbital-acetaminophen-caffeine (FIORICET, ESGIC) 50-325-40 MG tablet Take 1-2 tablets by mouth every 6 (six) hours as needed for headache. 01/11/16   Joni Reining, PA-C  cloNIDine (CATAPRES) 0.1 MG tablet Take 0.1 mg by mouth 3 (three) times daily.    Historical Provider, MD  etodolac (LODINE) 400 MG tablet Take 1 tablet (400 mg total) by mouth 2 (two) times daily. 10/12/15   Tommi Rumps, PA-C  methadone (DOLOPHINE) 5 MG tablet Take 2 tabs PO 3 times a day and 1 tab PO at HS x 7 days 09/25/15 10/02/15  Delano Metz, MD  methadone (DOLOPHINE) 5 MG tablet Take 1 tablet (5 mg total) by mouth 6 (six) times daily. 10/02/15 10/09/15  Delano Metz, MD  methadone (DOLOPHINE) 5 MG tablet Take 1 tablet (5 mg total) by mouth 5 (five) times daily. 10/09/15 10/16/15  Delano Metz, MD  methadone (DOLOPHINE) 5 MG tablet Take 1 tablet (5 mg total) by mouth 4 (four) times daily. 10/16/15 10/23/15  Delano Metz, MD  methadone (DOLOPHINE) 5 MG tablet Take 1 tablet (5 mg total) by mouth 3 (three) times daily. 10/23/15 10/30/15  Delano Metz, MD  methadone (DOLOPHINE) 5 MG tablet Take 1 tablet (5 mg total) by mouth 2 (two) times daily. 10/30/15 11/06/15  Delano Metz, MD  methadone  (DOLOPHINE) 5 MG tablet Take 1 tablet (5 mg total) by mouth daily. 11/06/15 11/13/15  Delano Metz, MD  methadone (DOLOPHINE) 5 MG tablet Take 1 tablet (5 mg total) by mouth every other day. 11/13/15 11/20/15  Delano Metz, MD  ondansetron (ZOFRAN ODT) 4 MG disintegrating tablet Take 1 tablet (4 mg total) by mouth every 8 (eight) hours as needed for nausea or vomiting. 07/31/15   Hildred Laser, MD  oxyCODONE (OXY IR/ROXICODONE) 5 MG immediate release tablet Take 2 tabs PO 3 times a day and 1 tab PO at HS x 7 days 09/25/15 10/02/15  Delano Metz, MD  oxyCODONE (OXY IR/ROXICODONE) 5 MG immediate release tablet Take 1 tablet (5 mg total) by mouth 6 (six) times daily. 10/02/15 10/09/15  Delano Metz, MD  oxyCODONE (OXY IR/ROXICODONE) 5 MG immediate release tablet Take 1 tablet (5 mg total) by mouth 5 (five) times daily. 10/09/15 10/16/15  Delano Metz, MD  oxyCODONE (OXY IR/ROXICODONE) 5 MG immediate release tablet Take 1 tablet (5 mg total) by mouth 4 (four) times daily. 10/16/15 10/23/15  Delano Metz, MD  oxyCODONE (OXY IR/ROXICODONE) 5 MG immediate release tablet Take 1 tablet (5 mg total) by mouth 3 (three)  times daily. 10/23/15 10/30/15  Delano Metz, MD  oxyCODONE (OXY IR/ROXICODONE) 5 MG immediate release tablet Take 1 tablet (5 mg total) by mouth 2 (two) times daily. 10/30/15 11/06/15  Delano Metz, MD  oxyCODONE (OXY IR/ROXICODONE) 5 MG immediate release tablet Take 1 tablet (5 mg total) by mouth daily. 11/06/15 11/13/15  Delano Metz, MD  oxyCODONE (OXY IR/ROXICODONE) 5 MG immediate release tablet Take 1 tablet (5 mg total) by mouth every other day. 11/13/15 11/20/15  Delano Metz, MD  QUEtiapine (SEROQUEL) 100 MG tablet Take 100 mg by mouth at bedtime.    Historical Provider, MD    Allergies  Allergen Reactions  . Codeine     Other reaction(s): Other (See Comments) Other Reaction: OTHER REACTION - ITCHING, SWEL  . Butorphanol Tartrate Itching and  Other (See Comments)    Other reaction(s): Other (See Comments) hallucinations Hallucinations  . Ketorolac     Other reaction(s): Other (See Comments) Burns face  . Promethazine     Other reaction(s): Other (See Comments) Arm spasms Other reaction(s): Other (See Comments) Other Reaction: ARM SPASMS Other reaction(s): Other (See Comments) Arm spasms  . Sumatriptan Succinate     Other reaction(s): Other (See Comments) Other Reaction: BURNING OF SKIN  . Topamax [Topiramate]     Muscles spams  . Toradol [Ketorolac Tromethamine] Other (See Comments)    Other reaction(s): Other (See Comments) Burns face Muscle spasms  . Tramadol     Other reaction(s): Other (See Comments) Other Reaction: IRRITABILITY AND INSOMNIA MUSCLE SPASMS IN ARMS  . Sulfa Antibiotics Rash    No family history on file.  Social History Social History  Substance Use Topics  . Smoking status: Never Smoker  . Smokeless tobacco: Never Used  . Alcohol use No    Review of Systems Constitutional: Negative for fever. Cardiovascular: Negative for chest pain. Respiratory: Negative for shortness of breath. Gastrointestinal: Negative for abdominal pain Neurological: Positive for headache denies any focal weakness or numbness. 10-point ROS otherwise negative.  ____________________________________________   PHYSICAL EXAM:  Constitutional: Alert and oriented. Well appearing and in no distress. Eyes: Normal exam, PERRL, no reaction to examination but does state photophobia. ENT   Head: Normocephalic and atraumatic   Mouth/Throat: Mucous membranes are moist. Cardiovascular: Normal rate, regular rhythm. No murmur Respiratory: Normal respiratory effort without tachypnea nor retractions. Breath sounds are clear  Gastrointestinal: Soft and nontender. No distention.   Musculoskeletal: Nontender with normal range of motion in all extremities. Neurologic:  Normal speech and language. No gross focal neurologic  deficits. Equal grip strengths. Cranial nerves intact. Skin:  Skin is warm, dry and intact.  Psychiatric: Mood and affect are normal. Speech and behavior are normal.   ____________________________________________   INITIAL IMPRESSION / ASSESSMENT AND PLAN / ED COURSE  Pertinent labs & imaging results that were available during my care of the patient were reviewed by me and considered in my medical decision making (see chart for details).  Patient presents the emergency department with continued headache for the past 5 days. Patient has a history of the same. I discussed with the patient that we do not use narcotic medications for headaches and she is allergic to most of the nonnarcotic medications including Toradol. Patient is also allergic to tramadol. Given the patient's continued headache we will place an IV and treat with Benadryl, Reglan, oral Tylenol, IV fluids and IV magnesium. No other concerning findings on physical examination. Intact neurological exam.  Patient sleeping comfortably on reevaluation. States the headache is  improved but not gone. As the patient sleeping comfortably I believe the best course of action will be to discharge the patient so she can rest at home. I discussed with the patient taking plenty of rest taking her prescribed Fioricet in 6 hours if she continues to have headache and going back to sleep. The patient is agreeable.  ____________________________________________   FINAL CLINICAL IMPRESSION(S) / ED DIAGNOSES  Migraine headache    Minna AntisKevin Enes Rokosz, MD 01/12/16 [phone]

## 2016-01-12 NOTE — ED Notes (Signed)
Pt verbalized understanding of discharge instructions. NAD at this time. 

## 2016-01-12 NOTE — ED Triage Notes (Signed)
Seen through ED last night for c/o headache, blurred vision and dizziness and foot fracture.  Returns today with c/o headache, blurred vision since Monday.  Had been given Fioricet for headache, no relief of symptoms.

## 2016-01-12 NOTE — ED Triage Notes (Signed)
Patient is AAOx3.  Skin warm and dry.  Moving all extremities equally and strong. Patient texting on phone throughout triage assessment.

## 2016-01-12 NOTE — ED Notes (Signed)
FIRST NURSE NOTE: Pt having multiple medical complaints. Pt states she was here last night for migraine, dizziness and was given a prescription for fioricet which she states is not helping.

## 2016-01-13 ENCOUNTER — Encounter: Payer: Self-pay | Admitting: *Deleted

## 2016-01-13 DIAGNOSIS — R51 Headache: Secondary | ICD-10-CM | POA: Insufficient documentation

## 2016-01-13 DIAGNOSIS — I10 Essential (primary) hypertension: Secondary | ICD-10-CM | POA: Insufficient documentation

## 2016-01-13 DIAGNOSIS — E039 Hypothyroidism, unspecified: Secondary | ICD-10-CM | POA: Diagnosis not present

## 2016-01-13 NOTE — ED Triage Notes (Signed)
Pt ambulatory to triage.  Pt reports she has a headache behind her eyes.  This is 3rd visit in 3 days with similar sx.  Pt taking fiorcet.  Pt alert.  Speech clear.

## 2016-01-13 NOTE — ED Notes (Signed)
No head ct scan at this time per dr Lenard Lancepaduchowski.

## 2016-01-14 ENCOUNTER — Emergency Department: Payer: BLUE CROSS/BLUE SHIELD

## 2016-01-14 ENCOUNTER — Encounter: Payer: Self-pay | Admitting: Radiology

## 2016-01-14 ENCOUNTER — Emergency Department
Admission: EM | Admit: 2016-01-14 | Discharge: 2016-01-14 | Disposition: A | Discharge status: Home / Self Care | Payer: BLUE CROSS/BLUE SHIELD | Attending: Emergency Medicine | Admitting: Emergency Medicine

## 2016-01-14 DIAGNOSIS — R519 Headache, unspecified: Secondary | ICD-10-CM

## 2016-01-14 DIAGNOSIS — R51 Headache: Secondary | ICD-10-CM

## 2016-01-14 LAB — COMPREHENSIVE METABOLIC PANEL
ALT: 16 U/L (ref 14–54)
ANION GAP: 6 (ref 5–15)
AST: 20 U/L (ref 15–41)
Albumin: 4.2 g/dL (ref 3.5–5.0)
Alkaline Phosphatase: 81 U/L (ref 38–126)
BUN: 10 mg/dL (ref 6–20)
CHLORIDE: 105 mmol/L (ref 101–111)
CO2: 28 mmol/L (ref 22–32)
CREATININE: 0.81 mg/dL (ref 0.44–1.00)
Calcium: 9.2 mg/dL (ref 8.9–10.3)
GFR calc non Af Amer: >60 mL/min (ref >60)
Glucose, Bld: 98 mg/dL (ref 65–99)
POTASSIUM: 3.6 mmol/L (ref 3.5–5.1)
SODIUM: 139 mmol/L (ref 135–145)
Total Bilirubin: 0.6 mg/dL (ref 0.3–1.2)
Total Protein: 7.4 g/dL (ref 6.5–8.1)

## 2016-01-14 LAB — CBC
HCT: 37.7 % (ref 35.0–47.0)
Hemoglobin: 13.1 g/dL (ref 12.0–16.0)
MCH: 29.3 pg (ref 26.0–34.0)
MCHC: 34.8 g/dL (ref 32.0–36.0)
MCV: 84.2 fL (ref 80.0–100.0)
PLATELETS: 221 10*3/uL (ref 150–440)
RBC: 4.48 MIL/uL (ref 3.80–5.20)
RDW: 14.4 % (ref 11.5–14.5)
WBC: 6.9 10*3/uL (ref 3.6–11.0)

## 2016-01-14 MED ORDER — METOCLOPRAMIDE HCL 5 MG/ML IJ SOLN
10.0000 mg | Freq: Once | INTRAMUSCULAR | Status: AC
  Administered 2016-01-14: 10 mg via INTRAVENOUS
  Filled 2016-01-14: qty 2

## 2016-01-14 MED ORDER — DIPHENHYDRAMINE HCL 50 MG/ML IJ SOLN
25.0000 mg | Freq: Once | INTRAMUSCULAR | Status: AC
  Administered 2016-01-14: 25 mg via INTRAVENOUS
  Filled 2016-01-14: qty 1

## 2016-01-14 MED ORDER — METHYLPREDNISOLONE SODIUM SUCC 125 MG IJ SOLR
125.0000 mg | Freq: Once | INTRAMUSCULAR | Status: AC
  Administered 2016-01-14: 125 mg via INTRAVENOUS
  Filled 2016-01-14: qty 2

## 2016-01-14 MED ORDER — METHYLPREDNISOLONE 4 MG PO TBPK: ORAL_TABLET | ORAL | 0 refills | Status: AC

## 2016-01-14 MED ORDER — IOPAMIDOL (ISOVUE-370) INJECTION 76%
75.0000 mL | Freq: Once | INTRAVENOUS | Status: AC | PRN
  Administered 2016-01-14: 75 mL via INTRAVENOUS

## 2016-01-14 NOTE — ED Provider Notes (Signed)
[facility] Emergency Department Provider Note   ____________________________________________   First MD Initiated Contact with Patient 01/14/16 0109     (approximate)  I have reviewed the triage vital signs and the nursing notes.   HISTORY  Chief Complaint Headache    HPI [name] is a 41 y.o. female comes into the hospital today with a headache. The patient reports that this is third visit she had to the ER. She thought she was having a migraine as she is to have a many years prior to her hysterectomy. The patient is having pressure behind her eyes and feeling unsteady on her feet. She reports that in the time she's been seen she has not any blood work or imaging studies done. She reports ear ago she had an enlarged lymph node in her abdomen and her face is puffy. She was given some Duricef but has not been helping. The patient did not go to bed until about 5:56 AM because of the pain. She reports that she's been eating and drinking without any vomiting. The patient has had some nausea with some photophobia. She rates pain 8 out of 10 in intensity. The patient has had some chills and some intermittent abdominal pain but she reports that that is not uncommon as she's had many abdominal surgeries. The patient has no cough no chest pain or runny nose. The patient decided to come back into the hospital today for further evaluation of these symptoms.   Past Medical History:  Diagnosis Date  . Ache in joint 02/04/2013  . Addiction, opium (HCC) 01/28/2012  . Anemia    H/O  . Complication of anesthesia   . Endometriosis   . Family history of adverse reaction to anesthesia    DAD-N/V, HEADACHES  . Heart murmur   . History of methicillin resistant staphylococcus aureus (MRSA) 2012  . Hypertension    H/O-BEEN AT LEAST 5 YEARS  . Hypothyroidism   . Interstitial cystitis   . Kidney stones    H/O CHRONIC KIDNEY STONE  . LBP (low back pain) 05/23/2013  .  PONV (postoperative nausea and vomiting)   . TIA (transient ischemic attack) 2007    Patient Active Problem List   Diagnosis Date Noted  . Pain medication agreement broken 09/16/2015  . Intractable abdominal pain 08/21/2015  . Chronic abdominal pain (Location of Primary Source of Pain) (RLQ: right lower quadrant) (Right) 03/25/2015  . Chronic flank pain (Location of Secondary source of pain) (Right) 03/25/2015  . Chronic groin pain (Location of Tertiary source of pain) (Right) 03/01/2015  . Chronic pain 11/27/2014  . Long term current use of opiate analgesic 11/27/2014  . Long term prescription opiate use 11/27/2014  . Opiate use (380 MME/Day) 11/27/2014  . Encounter for therapeutic drug level monitoring 11/27/2014  . Encounter for chronic pain management 11/27/2014  . Chronic low back pain (Location of Primary Source of Pain) (Bilateral) (R>L) 11/27/2014  . Osteoarthrosis 11/27/2014  . 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 Methadone use (HCC) 11/27/2014  . History of methadone use (HCC) 11/27/2014  . Pain due to interstitial cystitis 11/27/2014  . Chronic interstitial cystitis 11/27/2014  . Chronic pelvic pain (Location of Secondary source of pain) (suprapubic) (Bilateral) (R>L) 11/27/2014  . Chronic hip pain (Right) 11/27/2014  . Obesity, Class I, BMI 30-34.9 (68% higher incidence of chronic low back pain) 11/27/2014  . History of migraine 11/27/2014  . Encounter for long-term (current) use of other high-risk medications 11/27/2014  .  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 Central chest pain 08/17/2013  . Cannot sleep 02/04/2013  . Ache in joint 02/04/2013  . Affective disorder (HCC) 06/16/2012  . Episodic mood disorder (HCC) 06/16/2012  . Flank pain 03/15/2012  . Anxiety state 01/28/2012  . Opioid dependence (HCC) 01/28/2012  . Continuous opioid dependence (HCC) 03/31/2011  . Calculus of kidney 03/31/2011    Past  Surgical History:  Procedure Laterality Date  . ABDOMINAL HYSTERECTOMY    . APPENDECTOMY    . BREAST ENHANCEMENT SURGERY    . carpal tunnel    . CARPAL TUNNEL RELEASE Left 01/28/2015   Procedure: CARPAL TUNNEL RELEASE;  Surgeon: Deeann Saint, MD;  Location: [facility] ORS;  Service: Orthopedics;  Laterality: Left;  . CESAREAN SECTION     X2  . CYSTOSCOPY WITH STENT PLACEMENT Left 07/31/2015   Procedure: CYSTOSCOPY WITH STENT PLACEMENT;  Surgeon: Hildred Laser, MD;  Location: [facility] ORS;  Service: Urology;  Laterality: Left;  . DIAGNOSTIC LAPAROSCOPY    . LITHOTRIPSY    . URETEROSCOPY WITH HOLMIUM LASER LITHOTRIPSY Left 07/31/2015   Procedure: URETEROSCOPY WITH HOLMIUM LASER LITHOTRIPSY;  Surgeon: Hildred Laser, MD;  Location: [facility] ORS;  Service: Urology;  Laterality: Left;    Prior to Admission medications   Medication Sig Start Date End Date Taking? Authorizing Provider  busPIRone (BUSPAR) 5 MG tablet Take 5 mg by mouth 3 (three) times daily.    Historical Provider, MD  butalbital-acetaminophen-caffeine (FIORICET, ESGIC) 50-325-40 MG tablet Take 1-2 tablets by mouth every 6 (six) hours as needed for headache. 01/11/16   Joni Reining, PA-C  cloNIDine (CATAPRES) 0.1 MG tablet Take 0.1 mg by mouth 3 (three) times daily.    Historical Provider, MD  etodolac (LODINE) 400 MG tablet Take 1 tablet (400 mg total) by mouth 2 (two) times daily. 10/12/15   Tommi Rumps, PA-C  methadone (DOLOPHINE) 5 MG tablet Take 2 tabs PO 3 times a day and 1 tab PO at HS x 7 days 09/25/15 10/02/15  Delano Metz, MD  methadone (DOLOPHINE) 5 MG tablet Take 1 tablet (5 mg total) by mouth 6 (six) times daily. 10/02/15 10/09/15  Delano Metz, MD  methadone (DOLOPHINE) 5 MG tablet Take 1 tablet (5 mg total) by mouth 5 (five) times daily. 10/09/15 10/16/15  Delano Metz, MD  methadone (DOLOPHINE) 5 MG tablet Take 1 tablet (5 mg total) by mouth 4 (four) times daily. 10/16/15 10/23/15  Delano Metz, MD    methadone (DOLOPHINE) 5 MG tablet Take 1 tablet (5 mg total) by mouth 3 (three) times daily. 10/23/15 10/30/15  Delano Metz, MD  methadone (DOLOPHINE) 5 MG tablet Take 1 tablet (5 mg total) by mouth 2 (two) times daily. 10/30/15 11/06/15  Delano Metz, MD  methadone (DOLOPHINE) 5 MG tablet Take 1 tablet (5 mg total) by mouth daily. 11/06/15 11/13/15  Delano Metz, MD  methadone (DOLOPHINE) 5 MG tablet Take 1 tablet (5 mg total) by mouth every other day. 11/13/15 11/20/15  Delano Metz, MD  methylPREDNISolone (MEDROL DOSEPAK) 4 MG TBPK tablet Take 6 pills on day 1 Take 5 pills on day 2 Take 4 pills on day 3 Take 3 pills on day 4 Take 2 pills on day 5 Take 1 pill on day 6 01/14/16   Rebecka Apley, MD  ondansetron (ZOFRAN ODT) 4 MG disintegrating tablet Take 1 tablet (4 mg total) by mouth every 8 (eight) hours as needed for nausea or vomiting.  07/31/15   Hildred Laser, MD  oxyCODONE (OXY IR/ROXICODONE) 5 MG immediate release tablet Take 2 tabs PO 3 times a day and 1 tab PO at HS x 7 days 09/25/15 10/02/15  Delano Metz, MD  oxyCODONE (OXY IR/ROXICODONE) 5 MG immediate release tablet Take 1 tablet (5 mg total) by mouth 6 (six) times daily. 10/02/15 10/09/15  Delano Metz, MD  oxyCODONE (OXY IR/ROXICODONE) 5 MG immediate release tablet Take 1 tablet (5 mg total) by mouth 5 (five) times daily. 10/09/15 10/16/15  Delano Metz, MD  oxyCODONE (OXY IR/ROXICODONE) 5 MG immediate release tablet Take 1 tablet (5 mg total) by mouth 4 (four) times daily. 10/16/15 10/23/15  Delano Metz, MD  oxyCODONE (OXY IR/ROXICODONE) 5 MG immediate release tablet Take 1 tablet (5 mg total) by mouth 3 (three) times daily. 10/23/15 10/30/15  Delano Metz, MD  oxyCODONE (OXY IR/ROXICODONE) 5 MG immediate release tablet Take 1 tablet (5 mg total) by mouth 2 (two) times daily. 10/30/15 11/06/15  Delano Metz, MD  oxyCODONE (OXY IR/ROXICODONE) 5 MG immediate release tablet Take 1  tablet (5 mg total) by mouth daily. 11/06/15 11/13/15  Delano Metz, MD  oxyCODONE (OXY IR/ROXICODONE) 5 MG immediate release tablet Take 1 tablet (5 mg total) by mouth every other day. 11/13/15 11/20/15  Delano Metz, MD  QUEtiapine (SEROQUEL) 100 MG tablet Take 100 mg by mouth at bedtime.    Historical Provider, MD    Allergies Codeine; Butorphanol tartrate; Ketorolac; Promethazine; Sumatriptan succinate; Topamax [topiramate]; Toradol [ketorolac tromethamine]; Tramadol; and Sulfa antibiotics  No family history on file.  Social History Social History  Substance Use Topics  . Smoking status: Never Smoker  . Smokeless tobacco: Never Used  . Alcohol use No    Review of Systems Constitutional: Chills Eyes: No visual changes. ENT: No sore throat. Cardiovascular: Denies chest pain. Respiratory: Denies shortness of breath. Gastrointestinal: No abdominal pain.  No nausea, no vomiting.  No diarrhea.  No constipation. Genitourinary: Negative for dysuria. Musculoskeletal: Negative for back pain. Skin: Negative for rash. Neurological: Headache, off balance  10-point ROS otherwise negative.  ____________________________________________   PHYSICAL EXAM:  VITAL SIGNS: ED Triage Vitals  Enc Vitals Group     BP 01/13/16 1946 117/68     Pulse Rate 01/13/16 1946 (!) 105     Resp 01/13/16 1946 20     Temp 01/13/16 1946 98.2 F (36.8 C)     Temp Source 01/13/16 1946 Oral     SpO2 01/13/16 1946 99 %     Weight 01/13/16 1946 125 lb (56.7 kg)     Height 01/13/16 1946 5' (1.524 m)     Head Circumference --      Peak Flow --      Pain Score 01/13/16 1947 7     Pain Loc --      Pain Edu? --      Excl. in GC? --     Constitutional: Alert and oriented. Well appearing and in Moderate distress. Eyes: Conjunctivae are normal. PERRL. EOMI. Head: Atraumatic. Nose: No congestion/rhinnorhea. Mouth/Throat: Mucous membranes are moist.  Oropharynx non-erythematous. Cardiovascular:  Normal rate, regular rhythm. Grossly normal heart sounds.  Good peripheral circulation. Respiratory: Normal respiratory effort.  No retractions. Lungs CTAB. Gastrointestinal: Soft and nontender. No distention. Positive bowel sounds Musculoskeletal: No lower extremity tenderness nor edema.   Neurologic:  Normal speech and language. Cranial nerves II through XII are grossly intact with no focal motor or neuro deficits Skin:  Skin is warm, dry  and intact.  Psychiatric: Mood and affect are normal.   ____________________________________________   LABS (all labs ordered are listed, but only abnormal results are displayed)  Labs Reviewed  CBC  COMPREHENSIVE METABOLIC PANEL   ____________________________________________  EKG  none ____________________________________________  RADIOLOGY  CT angio head and neck ____________________________________________   PROCEDURES  Procedure(s) performed: None  Procedures  Critical Care performed: No  ____________________________________________   INITIAL IMPRESSION / ASSESSMENT AND PLAN / ED COURSE  Pertinent labs & imaging results that were available during my care of the patient were reviewed by me and considered in my medical decision making (see chart for details).  This is a 41 year old female who comes into the hospital today with a headache. The patient has been here 2 other days and was discharged home but she reports that the headache keeps coming back. I did send some blood work on the patient as well as did a CTA of her head and her neck. The patient's workup at this time is unremarkable. She received some Reglan, Benadryl and Solu-Medrol. The patient reports that the headache is improved after medication but she still has some pressure behind her eyes. I discussed with the patient the results and then asked her if she would want to consider a lumbar puncture. The CTA is a very good test but I told her that the lumbar puncture  would give [place] more details. I also offered for the patient to be discharged and to follow-up with neurology. The patient reports that if she was not going to be admitted to the hospital then she would want to just go home and follow-up. I told her that that was okay since her headache was improved. I will put the patient on a Medrol Dosepak and have her follow-up with neurology. The patient has no further questions or concerns at this time.  Clinical Course as of Jan 13 853  Tue Jan 14, 2016  0347 1. No acute intracranial abnormality. 2. Normal CTA of the head. 3. Normal CTA of the neck.   CT Angio Head W or Wo Contrast [AW]    Clinical Course User Index [AW] Rebecka Apley, MD     ____________________________________________   FINAL CLINICAL IMPRESSION(S) / ED DIAGNOSES  Final diagnoses:  Nonintractable headache, unspecified chronicity pattern, unspecified headache type      NEW MEDICATIONS STARTED DURING THIS VISIT:  Discharge Medication List as of 01/14/2016  5:18 AM    START taking these medications   Details  methylPREDNISolone (MEDROL DOSEPAK) 4 MG TBPK tablet Take 6 pills on day 1 Take 5 pills on day 2 Take 4 pills on day 3 Take 3 pills on day 4 Take 2 pills on day 5 Take 1 pill on day 6, Print         Note:  This document was prepared using Dragon voice recognition software and may include unintentional dictation errors.    Rebecka Apley, MD 01/14/16 [phone]

## 2016-01-14 NOTE — ED Notes (Signed)
Pt discharged to home.  Family member driving.  Discharge instructions reviewed.  Verbalized understanding.  No questions or concerns at this time.  Teach back verified.  Pt in NAD.  No items left in ED.   

## 2016-01-26 ENCOUNTER — Emergency Department: Payer: BLUE CROSS/BLUE SHIELD

## 2016-01-26 ENCOUNTER — Encounter: Payer: Self-pay | Admitting: Emergency Medicine

## 2016-01-26 ENCOUNTER — Emergency Department
Admission: EM | Admit: 2016-01-26 | Discharge: 2016-01-26 | Disposition: A | Discharge status: Home / Self Care | Payer: BLUE CROSS/BLUE SHIELD | Attending: Emergency Medicine | Admitting: Emergency Medicine

## 2016-01-26 DIAGNOSIS — E039 Hypothyroidism, unspecified: Secondary | ICD-10-CM | POA: Insufficient documentation

## 2016-01-26 DIAGNOSIS — S8002XA Contusion of left knee, initial encounter: Secondary | ICD-10-CM

## 2016-01-26 DIAGNOSIS — Y939 Activity, unspecified: Secondary | ICD-10-CM | POA: Insufficient documentation

## 2016-01-26 DIAGNOSIS — Z79899 Other long term (current) drug therapy: Secondary | ICD-10-CM | POA: Diagnosis not present

## 2016-01-26 DIAGNOSIS — W010XXA Fall on same level from slipping, tripping and stumbling without subsequent striking against object, initial encounter: Secondary | ICD-10-CM | POA: Insufficient documentation

## 2016-01-26 DIAGNOSIS — S8992XA Unspecified injury of left lower leg, initial encounter: Secondary | ICD-10-CM | POA: Diagnosis present

## 2016-01-26 DIAGNOSIS — Y999 Unspecified external cause status: Secondary | ICD-10-CM | POA: Diagnosis not present

## 2016-01-26 DIAGNOSIS — M25462 Effusion, left knee: Secondary | ICD-10-CM | POA: Diagnosis not present

## 2016-01-26 DIAGNOSIS — Y929 Unspecified place or not applicable: Secondary | ICD-10-CM | POA: Insufficient documentation

## 2016-01-26 DIAGNOSIS — I10 Essential (primary) hypertension: Secondary | ICD-10-CM | POA: Diagnosis not present

## 2016-01-26 MED ORDER — OXYCODONE-ACETAMINOPHEN 5-325 MG PO TABS: 1.0000 | ORAL_TABLET | Freq: Four times a day (QID) | ORAL | 0 refills | Status: AC | PRN

## 2016-01-26 MED ORDER — OXYCODONE-ACETAMINOPHEN 5-325 MG PO TABS
1.0000 | ORAL_TABLET | Freq: Once | ORAL | Status: AC
  Administered 2016-01-26: 1 via ORAL
  Filled 2016-01-26: qty 1

## 2016-01-26 NOTE — ED Notes (Signed)
NAD noted at time of D/C. Pt D/C into the care of SO at this time to drive her home. Pt taken to the lobby via wheelchair by her SO. Denies any comments/concerns at this time.

## 2016-01-26 NOTE — ED Provider Notes (Signed)
[facility] Emergency Department Provider Note  ____________________________________________   First MD Initiated Contact with Patient 01/26/16 1128     (approximate)  I have reviewed the triage vital signs and the nursing notes.   HISTORY  Chief Complaint Knee Pain   HPI [name] is a 41 y.o. female chief complaint of left knee pain after falling on ice last night. Patient states that she is continued to have pain in her left knee. She denies any head injury or loss of consciousness. She denies any cervical pain. She has been ambulatory since her fall but has increased pain with weightbearing. Denies any previous injury to her left knee. Patient has not taken any over-the-counter medication last night or this morning. She rates her pain as an 8 out of 10 at this time.   Past Medical History:  Diagnosis Date  . Ache in joint 02/04/2013  . Addiction, opium (HCC) 01/28/2012  . Anemia    H/O  . Complication of anesthesia   . Endometriosis   . Family history of adverse reaction to anesthesia    DAD-N/V, HEADACHES  . Heart murmur   . History of methicillin resistant staphylococcus aureus (MRSA) 2012  . Hypertension    H/O-BEEN AT LEAST 5 YEARS  . Hypothyroidism   . Interstitial cystitis   . Kidney stones    H/O CHRONIC KIDNEY STONE  . LBP (low back pain) 05/23/2013  . PONV (postoperative nausea and vomiting)   . TIA (transient ischemic attack) 2007    Patient Active Problem List   Diagnosis Date Noted  . Pain medication agreement broken 09/16/2015  . Intractable abdominal pain 08/21/2015  . Chronic abdominal pain (Location of Primary Source of Pain) (RLQ: right lower quadrant) (Right) 03/25/2015  . Chronic flank pain (Location of Secondary source of pain) (Right) 03/25/2015  . Chronic groin pain (Location of Tertiary source of pain) (Right) 03/01/2015  . Chronic pain 11/27/2014  . Long term current use of opiate analgesic 11/27/2014  .  Long term prescription opiate use 11/27/2014  . Opiate use (380 MME/Day) 11/27/2014  . Encounter for therapeutic drug level monitoring 11/27/2014  . Encounter for chronic pain management 11/27/2014  . Chronic low back pain (Location of Primary Source of Pain) (Bilateral) (R>L) 11/27/2014  . Osteoarthrosis 11/27/2014  . 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 Methadone use (HCC) 11/27/2014  . History of methadone use (HCC) 11/27/2014  . Pain due to interstitial cystitis 11/27/2014  . Chronic interstitial cystitis 11/27/2014  . Chronic pelvic pain (Location of Secondary source of pain) (suprapubic) (Bilateral) (R>L) 11/27/2014  . Chronic hip pain (Right) 11/27/2014  . Obesity, Class I, BMI 30-34.9 (68% higher incidence of chronic low back pain) 11/27/2014  . History of migraine 11/27/2014  . Encounter for long-term (current) use of other high-risk medications 11/27/2014  . 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 Central chest pain 08/17/2013  . Cannot sleep 02/04/2013  . Ache in joint 02/04/2013  . Affective disorder (HCC) 06/16/2012  . Episodic mood disorder (HCC) 06/16/2012  . Flank pain 03/15/2012  . Anxiety state 01/28/2012  . Opioid dependence (HCC) 01/28/2012  . Continuous opioid dependence (HCC) 03/31/2011  . Calculus of kidney 03/31/2011    Past Surgical History:  Procedure Laterality Date  . ABDOMINAL HYSTERECTOMY    . APPENDECTOMY    . BREAST ENHANCEMENT SURGERY    . carpal tunnel    . CARPAL TUNNEL RELEASE Left 01/28/2015  Procedure: CARPAL TUNNEL RELEASE;  Surgeon: Deeann SaintHoward Miller, MD;  Location: [facility] ORS;  Service: Orthopedics;  Laterality: Left;  . CESAREAN SECTION     X2  . CYSTOSCOPY WITH STENT PLACEMENT Left 07/31/2015   Procedure: CYSTOSCOPY WITH STENT PLACEMENT;  Surgeon: Hildred LaserBrian James Budzyn, MD;  Location: [facility] ORS;  Service: Urology;  Laterality: Left;  . DIAGNOSTIC LAPAROSCOPY    . LITHOTRIPSY    .  URETEROSCOPY WITH HOLMIUM LASER LITHOTRIPSY Left 07/31/2015   Procedure: URETEROSCOPY WITH HOLMIUM LASER LITHOTRIPSY;  Surgeon: Hildred LaserBrian James Budzyn, MD;  Location: [facility] ORS;  Service: Urology;  Laterality: Left;    Prior to Admission medications   Medication Sig Start Date End Date Taking? Authorizing Provider  busPIRone (BUSPAR) 5 MG tablet Take 5 mg by mouth 3 (three) times daily.    Historical Provider, MD  cloNIDine (CATAPRES) 0.1 MG tablet Take 0.1 mg by mouth 3 (three) times daily.    Historical Provider, MD  methadone (DOLOPHINE) 5 MG tablet Take 2 tabs PO 3 times a day and 1 tab PO at HS x 7 days 09/25/15 10/02/15  Delano MetzFrancisco Naveira, MD  methadone (DOLOPHINE) 5 MG tablet Take 1 tablet (5 mg total) by mouth 6 (six) times daily. 10/02/15 10/09/15  Delano MetzFrancisco Naveira, MD  methadone (DOLOPHINE) 5 MG tablet Take 1 tablet (5 mg total) by mouth 5 (five) times daily. 10/09/15 10/16/15  Delano MetzFrancisco Naveira, MD  methadone (DOLOPHINE) 5 MG tablet Take 1 tablet (5 mg total) by mouth 4 (four) times daily. 10/16/15 10/23/15  Delano MetzFrancisco Naveira, MD  methadone (DOLOPHINE) 5 MG tablet Take 1 tablet (5 mg total) by mouth 3 (three) times daily. 10/23/15 10/30/15  Delano MetzFrancisco Naveira, MD  methadone (DOLOPHINE) 5 MG tablet Take 1 tablet (5 mg total) by mouth 2 (two) times daily. 10/30/15 11/06/15  Delano MetzFrancisco Naveira, MD  methadone (DOLOPHINE) 5 MG tablet Take 1 tablet (5 mg total) by mouth daily. 11/06/15 11/13/15  Delano MetzFrancisco Naveira, MD  methadone (DOLOPHINE) 5 MG tablet Take 1 tablet (5 mg total) by mouth every other day. 11/13/15 11/20/15  Delano MetzFrancisco Naveira, MD  methylPREDNISolone (MEDROL DOSEPAK) 4 MG TBPK tablet Take 6 pills on day 1 Take 5 pills on day 2 Take 4 pills on day 3 Take 3 pills on day 4 Take 2 pills on day 5 Take 1 pill on day 6 01/14/16   Rebecka ApleyAllison P Webster, MD  ondansetron (ZOFRAN ODT) 4 MG disintegrating tablet Take 1 tablet (4 mg total) by mouth every 8 (eight) hours as needed for nausea or vomiting. 07/31/15    Hildred LaserBrian James Budzyn, MD  oxyCODONE (OXY IR/ROXICODONE) 5 MG immediate release tablet Take 2 tabs PO 3 times a day and 1 tab PO at HS x 7 days 09/25/15 10/02/15  Delano MetzFrancisco Naveira, MD  oxyCODONE (OXY IR/ROXICODONE) 5 MG immediate release tablet Take 1 tablet (5 mg total) by mouth 6 (six) times daily. 10/02/15 10/09/15  Delano MetzFrancisco Naveira, MD  oxyCODONE (OXY IR/ROXICODONE) 5 MG immediate release tablet Take 1 tablet (5 mg total) by mouth 5 (five) times daily. 10/09/15 10/16/15  Delano MetzFrancisco Naveira, MD  oxyCODONE (OXY IR/ROXICODONE) 5 MG immediate release tablet Take 1 tablet (5 mg total) by mouth 4 (four) times daily. 10/16/15 10/23/15  Delano MetzFrancisco Naveira, MD  oxyCODONE (OXY IR/ROXICODONE) 5 MG immediate release tablet Take 1 tablet (5 mg total) by mouth 3 (three) times daily. 10/23/15 10/30/15  Delano MetzFrancisco Naveira, MD  oxyCODONE (OXY IR/ROXICODONE) 5 MG immediate release tablet Take 1 tablet (5 mg total) by mouth 2 (  two) times daily. 10/30/15 11/06/15  Delano Metz, MD  oxyCODONE (OXY IR/ROXICODONE) 5 MG immediate release tablet Take 1 tablet (5 mg total) by mouth daily. 11/06/15 11/13/15  Delano Metz, MD  oxyCODONE (OXY IR/ROXICODONE) 5 MG immediate release tablet Take 1 tablet (5 mg total) by mouth every other day. 11/13/15 11/20/15  Delano Metz, MD  oxyCODONE-acetaminophen (PERCOCET) 5-325 MG tablet Take 1 tablet by mouth every 6 (six) hours as needed for severe pain. 01/26/16   Tommi Rumps, PA-C  QUEtiapine (SEROQUEL) 100 MG tablet Take 100 mg by mouth at bedtime.    Historical Provider, MD    Allergies Codeine; Butorphanol tartrate; Ketorolac; Promethazine; Sumatriptan succinate; Topamax [topiramate]; Toradol [ketorolac tromethamine]; Tramadol; and Sulfa antibiotics  History reviewed. No pertinent family history.  Social History Social History  Substance Use Topics  . Smoking status: Never Smoker  . Smokeless tobacco: Never Used  . Alcohol use No    Review of  Systems Constitutional: No fever/chills Eyes: No visual changes. ENT: No trauma Cardiovascular: Denies chest pain. Respiratory: Denies shortness of breath. Gastrointestinal:   No nausea, no vomiting.  Genitourinary: Negative for dysuria. Musculoskeletal: Negative for back or neck pain. Positive for left knee pain. Neurological: Negative for headaches, focal weakness or numbness.  10-point ROS otherwise negative.  ____________________________________________   PHYSICAL EXAM:  VITAL SIGNS: ED Triage Vitals [01/26/16 1027]  Enc Vitals Group     BP 118/76     Pulse Rate 71     Resp 18     Temp 98.3 F (36.8 C)     Temp Source Oral     SpO2 100 %     Weight 125 lb (56.7 kg)     Height 5' (1.524 m)     Head Circumference      Peak Flow      Pain Score 8     Pain Loc      Pain Edu?      Excl. in GC?     Constitutional: Alert and oriented. Well appearing and in no acute distress. Eyes: Conjunctivae are normal. PERRL. EOMI. Head: Atraumatic. Nose: No congestion/rhinnorhea. Neck: No stridor.   Cardiovascular: Normal rate, regular rhythm. Grossly normal heart sounds.  Good peripheral circulation. Respiratory: Normal respiratory effort.  No retractions. Lungs CTAB. Musculoskeletal: Examination of the left knee there is no gross deformity noted. There is some minimal effusion present. There is moderate tenderness on palpation of the anterior knee. Range of motion is restricted secondary to discomfort. No crepitus was noted. Ligaments are slightly unstable on exam. There are a few superficial abrasions anterior patellar area. Neurologic:  Normal speech and language. No gross focal neurologic deficits are appreciated. No gait instability. Skin:  Skin is warm, dry and intact. No ecchymosis is noted. Abrasions as noted above. Psychiatric: Mood and affect are normal. Speech and behavior are normal.  ____________________________________________   LABS (all labs ordered are listed,  but only abnormal results are displayed)  Labs Reviewed - No data to display  RADIOLOGY  Left knee x-ray per radiologist is negative for fracture the suspected small suprapatella effusion. ____________________________________________   PROCEDURES  Procedure(s) performed: None  Procedures  Critical Care performed: No  ____________________________________________   INITIAL IMPRESSION / ASSESSMENT AND PLAN / ED COURSE  Pertinent labs & imaging results that were available during my care of the patient were reviewed by me and considered in my medical decision making (see chart for details).  Patient was given Percocet for pain while in  the department. Patient is placed in knee immobilizer. She is also given a prescription for Percocet No. 15 one tablet every 6 hours as needed for pain. She is instructed to ice and elevate as needed for pain and swelling. She'll follow-up with her primary care doctor and also to follow-up with Dr. Ernest Pine for her knee problem.   ____________________________________________   FINAL CLINICAL IMPRESSION(S) / ED DIAGNOSES  Final diagnoses:  Contusion of left knee, initial encounter  Effusion of left knee      NEW MEDICATIONS STARTED DURING THIS VISIT:  New Prescriptions   OXYCODONE-ACETAMINOPHEN (PERCOCET) 5-325 MG TABLET    Take 1 tablet by mouth every 6 (six) hours as needed for severe pain.     Note:  This document was prepared using Dragon voice recognition software and may include unintentional dictation errors.    Tommi Rumps, PA-C 01/26/16 1223    Nita Sickle, MD 01/28/16 [phone]

## 2016-01-26 NOTE — Discharge Instructions (Signed)
Ice and elevate to reduce swelling and help with pain. Percocet as needed for severe pain. Continue regular medication. Follow-up with your primary care doctor if any continued problems. Follow-up with Dr. Ernest PineHooten who is the orthopedist on-call if any continued problems with your  knee. Wear knee immobilizer for support.

## 2016-01-26 NOTE — ED Triage Notes (Signed)
Pt slipped on ice last night and c/o left knee pain. Worse when bends knee.

## 2016-02-13 ENCOUNTER — Encounter: Payer: Self-pay | Admitting: Urology

## 2016-03-03 ENCOUNTER — Other Ambulatory Visit: Payer: Self-pay | Admitting: Orthopedic Surgery

## 2016-03-03 DIAGNOSIS — M25462 Effusion, left knee: Secondary | ICD-10-CM

## 2016-03-08 ENCOUNTER — Ambulatory Visit
Admission: RE | Admit: 2016-03-08 | Discharge: 2016-03-08 | Disposition: A | Discharge status: Home / Self Care | Payer: BLUE CROSS/BLUE SHIELD | Source: Ambulatory Visit | Attending: Orthopedic Surgery | Admitting: Orthopedic Surgery

## 2016-03-08 DIAGNOSIS — M25462 Effusion, left knee: Secondary | ICD-10-CM

## 2016-07-05 DEATH — deceased

## 2017-01-13 IMAGING — CT CT ANGIO NECK
1 of 11 series · 5 of 33 positions shown · IV contrast (APPLIED)
Comparison: None.

CLINICAL DATA: Headache for 5 days

EXAM:
CT ANGIOGRAPHY HEAD AND NECK
TECHNIQUE: Multidetector CT imaging of the head and neck was performed using
the standard protocol during bolus administration of intravenous
contrast. Multiplanar CT image reconstructions and MIPs were
obtained to evaluate the vascular anatomy. Carotid stenosis
measurements (when applicable) are obtained utilizing NASCET
criteria, using the distal internal carotid diameter as the
denominator.
CONTRAST:  75 mL Isovue 370 IV

[Series 11: ax thin · axial · 0.39mm/px · z∈[-280,-58]mm · 5 of 334 slices shown]
[im 56/334  soft-tissue]
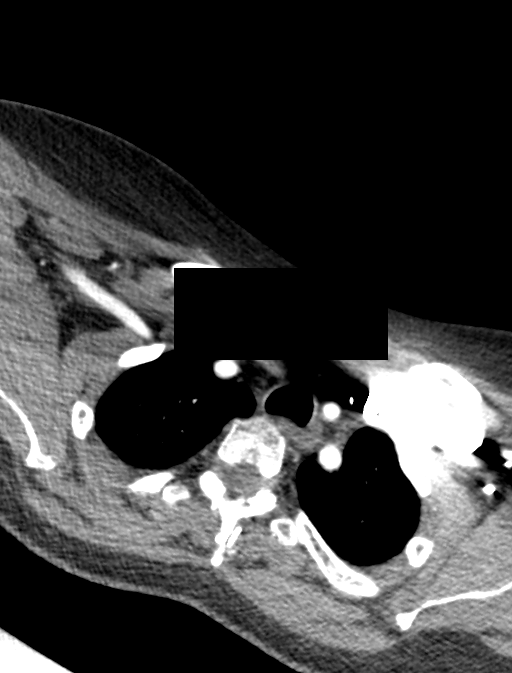
[im 112/334  bone]
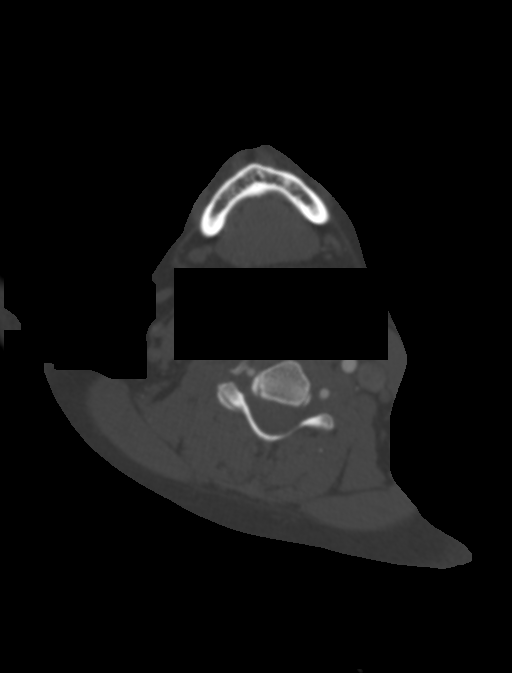
[im 167/334  soft-tissue]
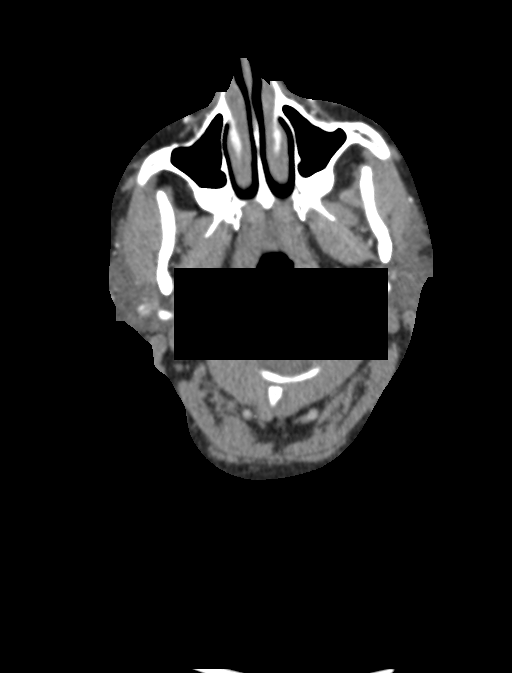
[im 223/334  bone]
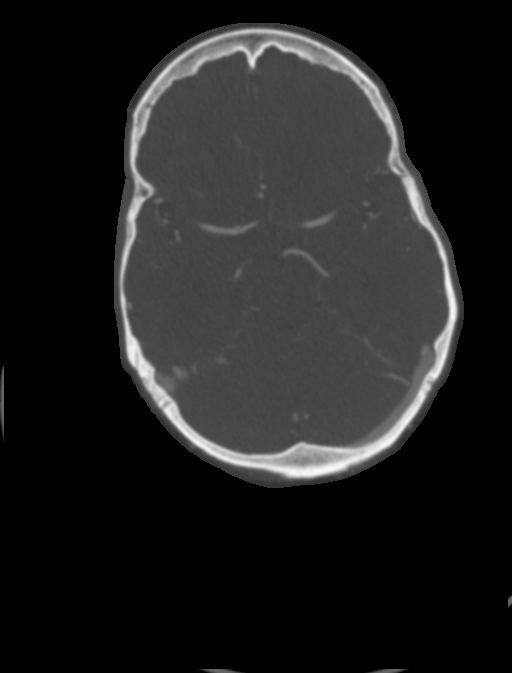
[im 278/334  soft-tissue]
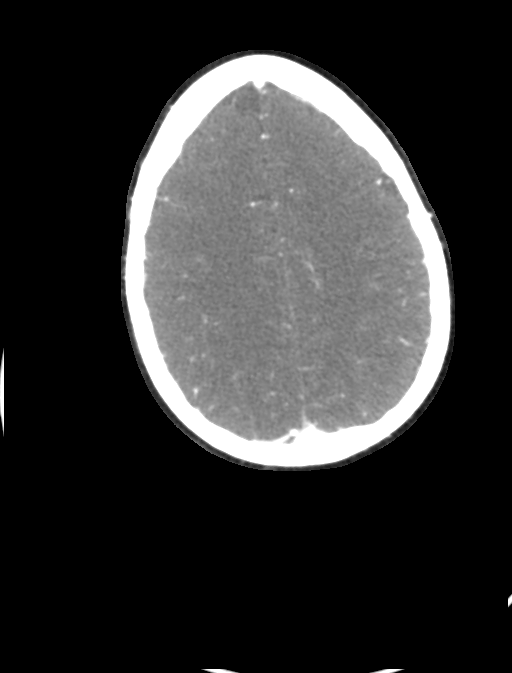

[5 of 33 positions shown; findings below may reference images not displayed]

FINDINGS: CT HEAD FINDINGS

Brain: No mass lesion, intraparenchymal hemorrhage or extra-axial
collection. No evidence of acute cortical infarct. Brain parenchyma
and CSF-containing spaces are normal for age.

Vascular: No hyperdense vessel or unexpected calcification.

Skull: Normal visualized skull base, calvarium and extracranial soft
tissues.

Sinuses/Orbits: No sinus fluid levels or advanced mucosal
thickening. No mastoid effusion. Normal orbits.

CTA NECK FINDINGS

Aortic arch: There is no aneurysm or dissection of the visualized
ascending aorta or aortic arch. There is a normal 3 vessel branching
pattern. The visualized proximal subclavian arteries are normal.

Right carotid system: The right common carotid origin is widely
patent. There is no common carotid or internal carotid artery
dissection or aneurysm. No hemodynamically significant stenosis.

Left carotid system: The left common carotid origin is widely
patent. There is no common carotid or internal carotid artery
dissection or aneurysm. No hemodynamically significant stenosis.

Vertebral arteries: The vertebral system is left dominant. Both
vertebral artery origins are normal. Both vertebral arteries are
normal to their confluence with the basilar artery.

Skeleton: There is no bony spinal canal stenosis. No lytic or
blastic lesions.

Other neck: The nasopharynx is clear. The oropharynx and hypopharynx
are normal. The epiglottis is normal. The supraglottic larynx,
glottis and subglottic larynx are normal. No retropharyngeal
collection. The parapharyngeal spaces are preserved. The parotid and
submandibular glands are normal. No sialolithiasis or salivary
ductal dilatation. The thyroid gland is normal. There is no cervical
lymphadenopathy.

Upper chest: No pneumothorax or pleural effusion. No nodules or
masses.

Review of the MIP images confirms the above findings

CTA HEAD FINDINGS

Anterior circulation:

--Intracranial internal carotid arteries: Normal.

--Anterior cerebral arteries: Normal.

--Middle cerebral arteries: Normal.

--Posterior communicating arteries: Present on the right. Not seen
on the left.

Posterior circulation:

--Posterior cerebral arteries: Normal.

--Superior cerebellar arteries: Normal.

--Basilar artery: Normal.

--Anterior inferior cerebellar arteries: Normal.

--Posterior inferior cerebellar arteries: Normal.

Venous sinuses: As permitted by contrast timing, patent.

Anatomic variants: None

Delayed phase: No parenchymal contrast enhancement.

Review of the MIP images confirms the above findings
IMPRESSION: 1. No acute intracranial abnormality.
2. Normal CTA of the head.
3. Normal CTA of the neck.

## 2017-01-25 IMAGING — CR DG KNEE COMPLETE 4+V*L*
1 series · 4 of 4 positions shown · non-contrast
Comparison: None.

CLINICAL DATA: Pain after trauma.

EXAM:
LEFT KNEE - COMPLETE 4+ VIEW

[Series 1: dg knee complete 4 views left · 0.14mm/px · 4 of 4 slices shown]
[im 1/4]
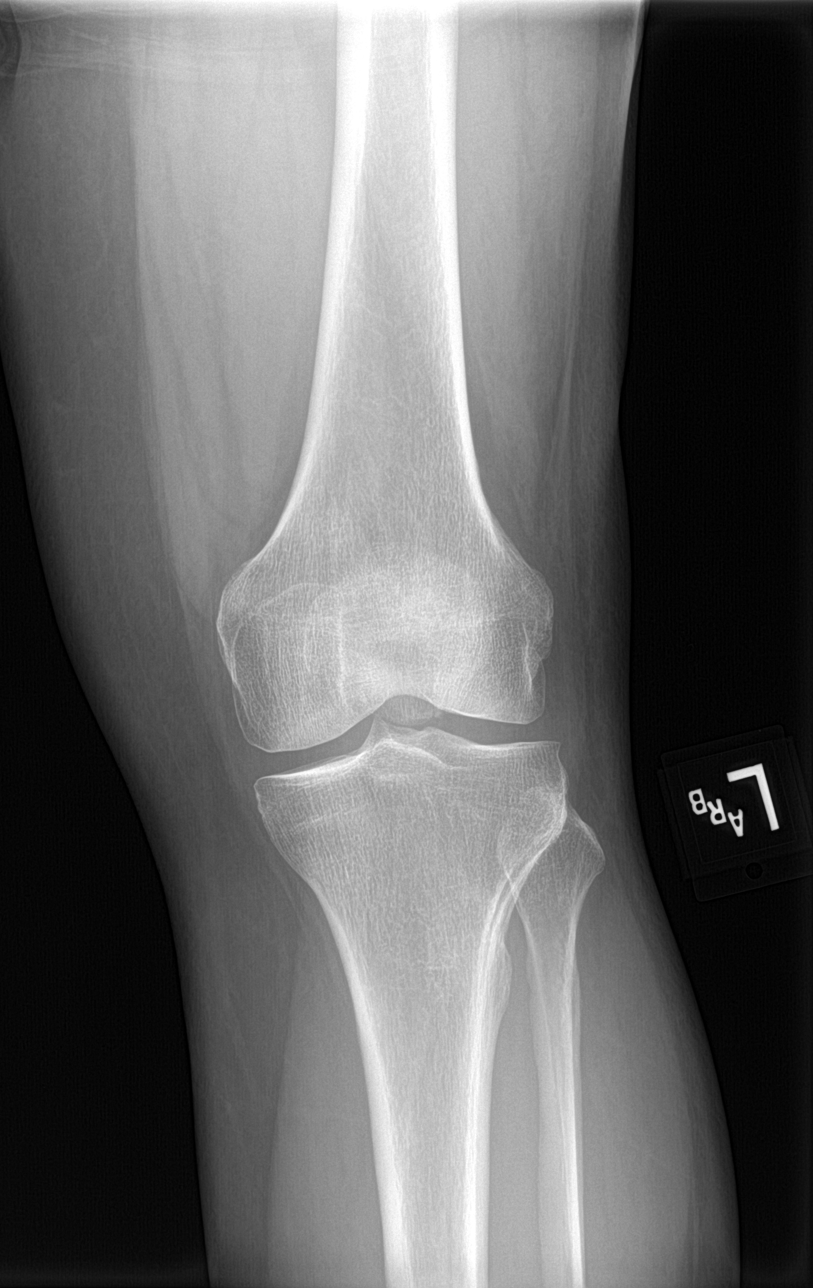
[im 2/4]
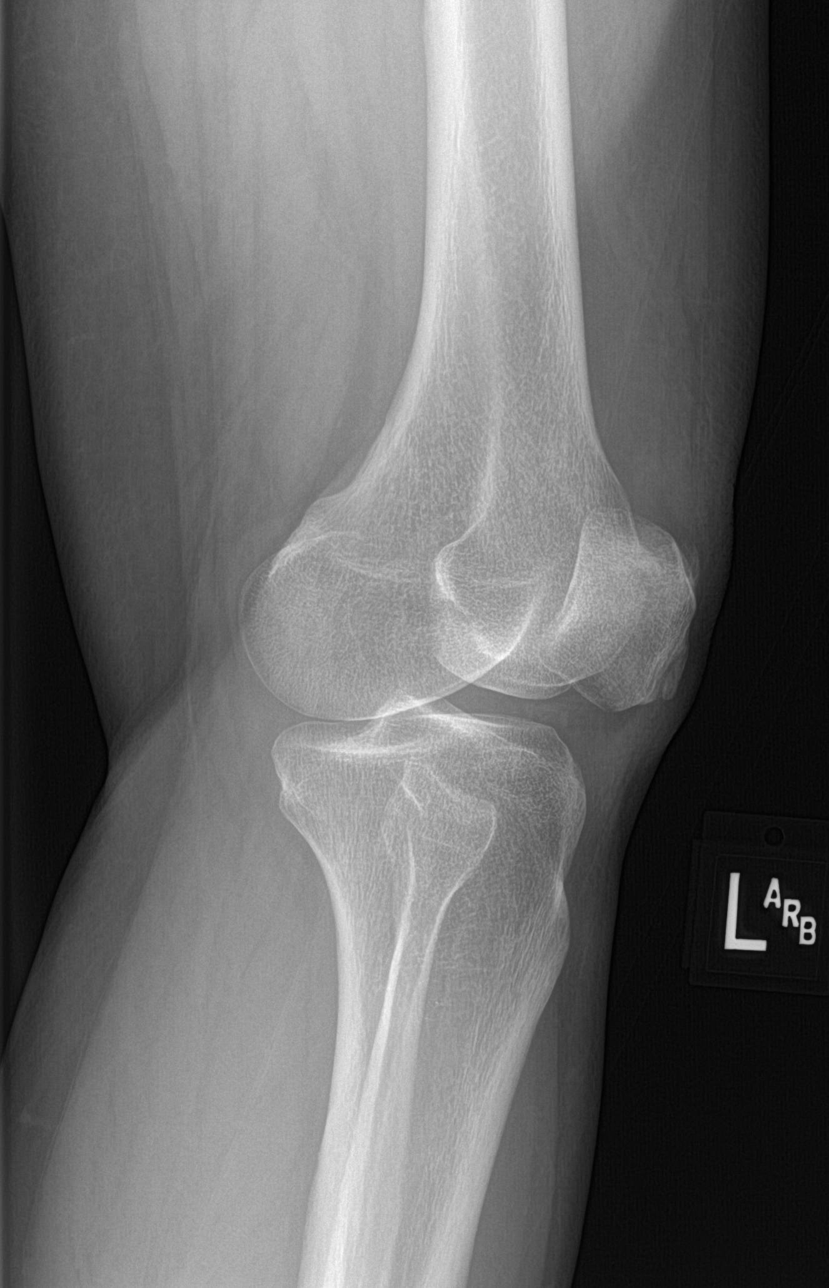
[im 3/4]
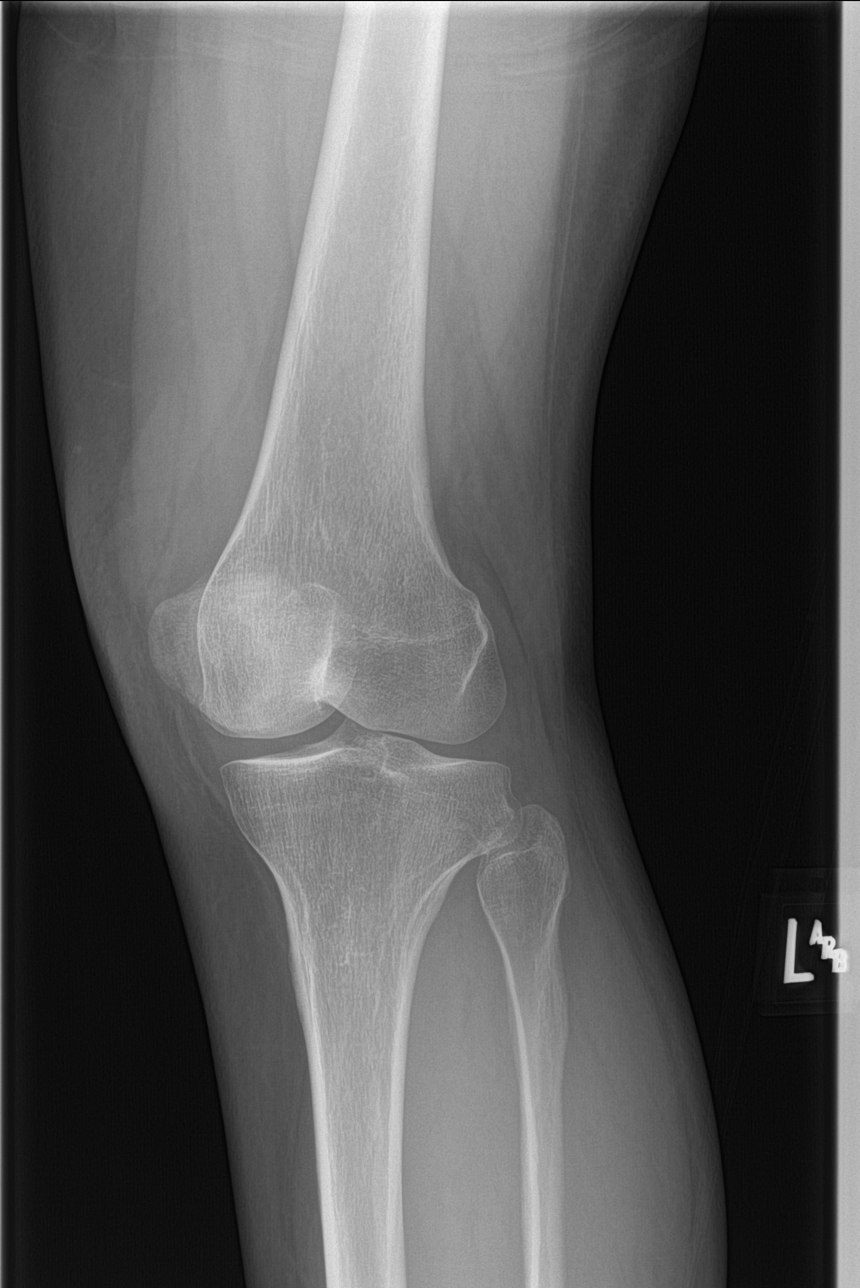
[im 4/4]
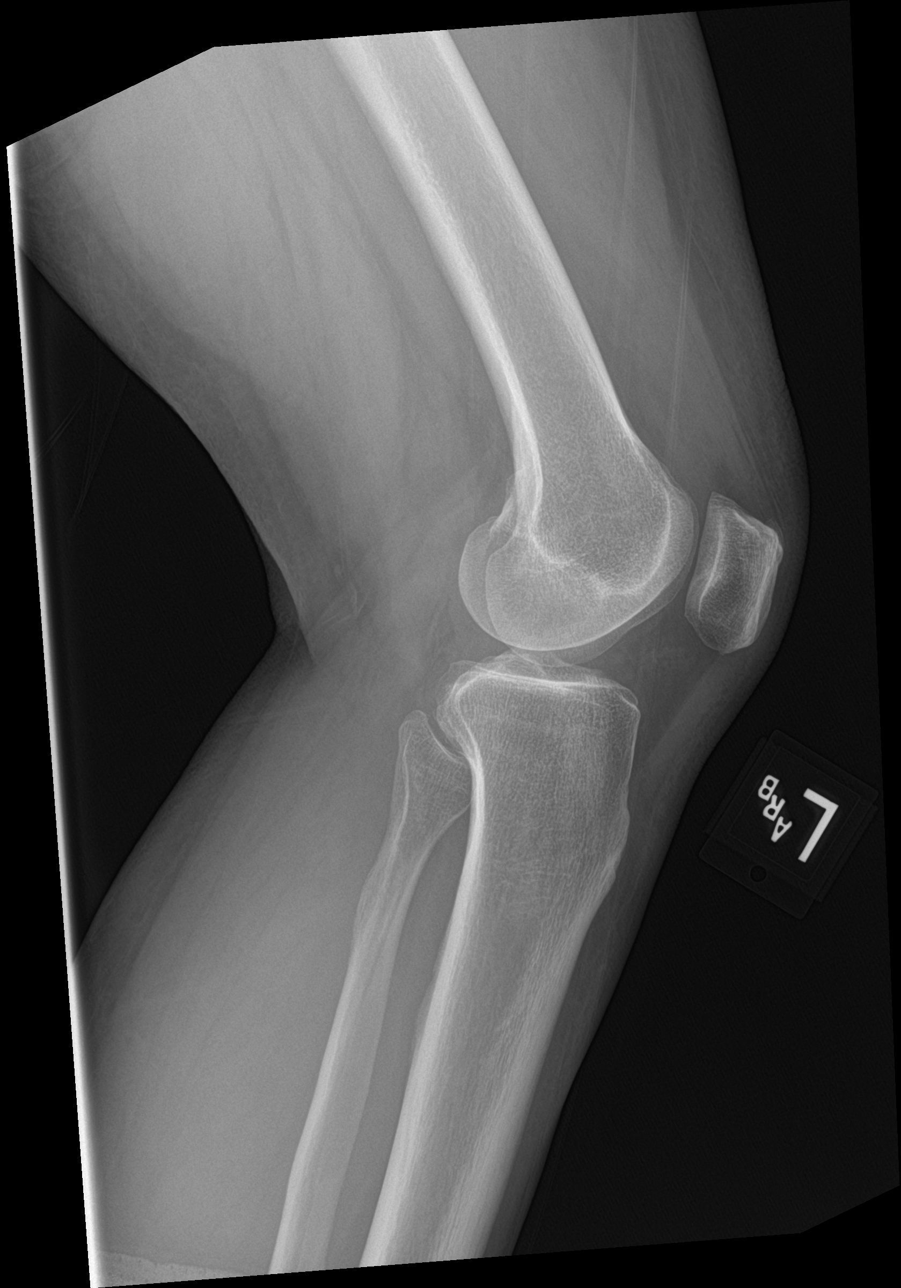

[4 of 4 positions shown; findings below may reference images not displayed]

FINDINGS: No fracture or dislocation.  Suspected small suprapatellar effusion.
IMPRESSION: Suspected small suprapatellar effusion with no fracture.
